# Patient Record
Sex: Male | Born: 2000 | Race: Black or African American | Hispanic: Yes | Marital: Single | Smoking: Never smoker
Health system: Southern US, Community
[De-identification: ages and names within clinical notes are randomized; demographics above are authoritative.]

## PROBLEM LIST (undated history)

## (undated) DIAGNOSIS — J45909 Unspecified asthma, uncomplicated: Secondary | ICD-10-CM

## (undated) DIAGNOSIS — H1010 Acute atopic conjunctivitis, unspecified eye: Secondary | ICD-10-CM

## (undated) DIAGNOSIS — K219 Gastro-esophageal reflux disease without esophagitis: Secondary | ICD-10-CM

## (undated) DIAGNOSIS — J309 Allergic rhinitis, unspecified: Secondary | ICD-10-CM

## (undated) HISTORY — PX: TONSILLECTOMY: SUR1361

## (undated) HISTORY — PX: ADENOIDECTOMY: SUR15

## (undated) HISTORY — DX: Gastro-esophageal reflux disease without esophagitis: K21.9

## (undated) HISTORY — DX: Unspecified asthma, uncomplicated: J45.909

## (undated) HISTORY — PX: MYRINGOTOMY WITH TUBE PLACEMENT: SHX5663

## (undated) HISTORY — DX: Acute atopic conjunctivitis, unspecified eye: J30.9

## (undated) HISTORY — DX: Acute atopic conjunctivitis, unspecified eye: H10.10

---
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
## 2021-01-02 LAB — ALLERGENS W/COMP RFLX AREA 2
Alternaria Alternata IgE: <0.1 kU/L
Aspergillus Fumigatus IgE: <0.1 kU/L
Bermuda Grass IgE: <0.1 kU/L
Cedar, Mountain IgE: <0.1 kU/L
Cladosporium Herbarum IgE: <0.1 kU/L
Cockroach, German IgE: <0.1 kU/L
Common Silver Birch IgE: <0.1 kU/L
Cottonwood IgE: <0.1 kU/L
D Farinae IgE: 26.8 kU/L — AB
D Pteronyssinus IgE: 25 kU/L — AB
E001-IgE Cat Dander: <0.1 kU/L
E005-IgE Dog Dander: <0.1 kU/L
Elm, American IgE: <0.1 kU/L
IgE (Immunoglobulin E), Serum: 63 IU/mL (ref 6–495)
Johnson Grass IgE: <0.1 kU/L
Maple/Box Elder IgE: <0.1 kU/L
Mouse Urine IgE: <0.1 kU/L
Oak, White IgE: <0.1 kU/L
Pecan, Hickory IgE: 0.14 kU/L — AB
Penicillium Chrysogen IgE: <0.1 kU/L
Pigweed, Rough IgE: <0.1 kU/L
Ragweed, Short IgE: <0.1 kU/L
Sheep Sorrel IgE Qn: <0.1 kU/L
Timothy Grass IgE: <0.1 kU/L
White Mulberry IgE: <0.1 kU/L

## 2021-03-03 ENCOUNTER — Other Ambulatory Visit: Payer: Self-pay

## 2021-03-03 ENCOUNTER — Encounter: Payer: Self-pay | Admitting: Pediatrics

## 2021-03-03 ENCOUNTER — Ambulatory Visit (INDEPENDENT_AMBULATORY_CARE_PROVIDER_SITE_OTHER): Payer: Medicaid Other | Admitting: Pediatrics

## 2021-03-03 VITALS — BP 118/82 | HR 81 | Ht 68.7 in | Wt 153.2 lb

## 2021-03-03 DIAGNOSIS — Z0000 Encounter for general adult medical examination without abnormal findings: Secondary | ICD-10-CM

## 2021-03-03 DIAGNOSIS — L2089 Other atopic dermatitis: Secondary | ICD-10-CM | POA: Diagnosis not present

## 2021-03-03 DIAGNOSIS — Z713 Dietary counseling and surveillance: Secondary | ICD-10-CM

## 2021-03-03 DIAGNOSIS — Z113 Encounter for screening for infections with a predominantly sexual mode of transmission: Secondary | ICD-10-CM

## 2021-03-03 DIAGNOSIS — J3089 Other allergic rhinitis: Secondary | ICD-10-CM | POA: Diagnosis not present

## 2021-03-03 DIAGNOSIS — J4540 Moderate persistent asthma, uncomplicated: Secondary | ICD-10-CM

## 2021-03-03 MED ORDER — ALBUTEROL SULFATE HFA 108 (90 BASE) MCG/ACT IN AERS: 4.0000 | INHALATION_SPRAY | Freq: Four times a day (QID) | RESPIRATORY_TRACT | 11 refills | Status: DC | PRN

## 2021-03-03 MED ORDER — MONTELUKAST SODIUM 10 MG PO TABS: ORAL_TABLET | ORAL | 11 refills | Status: DC

## 2021-03-03 MED ORDER — FLUTICASONE PROPIONATE HFA 110 MCG/ACT IN AERO: INHALATION_SPRAY | RESPIRATORY_TRACT | 11 refills | Status: DC

## 2021-03-03 MED ORDER — TRIAMCINOLONE ACETONIDE 0.025 % EX OINT: 1.0000 "application " | TOPICAL_OINTMENT | Freq: Two times a day (BID) | CUTANEOUS | 0 refills | Status: DC

## 2021-03-03 MED ORDER — CETIRIZINE HCL 10 MG PO TABS: 10.0000 mg | ORAL_TABLET | Freq: Two times a day (BID) | ORAL | 11 refills | Status: DC

## 2021-03-03 MED ORDER — OLOPATADINE HCL 0.2 % OP SOLN: 1.0000 [drp] | Freq: Every day | OPHTHALMIC | 11 refills | Status: AC | PRN

## 2021-03-03 MED ORDER — FLUTICASONE PROPIONATE 50 MCG/ACT NA SUSP: 2.0000 | Freq: Every day | NASAL | 11 refills | Status: DC

## 2021-03-03 NOTE — Patient Instructions (Signed)
Well Child Nutrition, Teen This sheet provides general nutrition recommendations. Talk with a health care provider or a diet and nutrition specialist (dietitian) if you have any questions. Nutrition The amount of food you need to eat every day depends on your age, sex, size, and activity level. To figure out your daily calorie needs, look for a calorie calculator online or talk with your health care provider. Balanced diet Eat a balanced diet. Try to include: Fruits. Aim for 1-2 cups a day. Examples of 1 cup of fruit include 1 large banana, 1 small apple, 8 large strawberries, or 1 large orange. Try to eat fresh or frozen fruits, and avoid fruits that have added sugars. Vegetables. Aim for 2-3 cups a day. Examples of 1 cup of vegetables include 2 medium carrots, 1 large tomato, or 2 stalks of celery. Try to eat vegetables with a variety of colors. Low-fat dairy. Aim for 3 cups a day. Examples of 1 cup of dairy include 8 oz (230 mL) of milk, 8 oz (230 g) of yogurt, or 1 oz (44 g) of natural cheese. Getting enough calcium and vitamin D is important for growth and healthy bones. Include fat-free or low-fat milk, cheese, and yogurt in your diet. If you are unable to tolerate dairy (lactose intolerant) or you choose not to consume dairy, you may include fortified soy beverages (soy milk). Whole grains. Of the grain foods that you eat each day (such as pasta, rice, and tortillas), aim to include 6-8 "ounce-equivalents" of whole-grain options. Examples of 1 ounce-equivalent of whole grains include 1 cup of whole-wheat cereal,  cup of brown rice, or 1 slice of whole-wheat bread. Lean proteins. Aim for 5-6 "ounce-equivalents" a day. Eat a variety of protein foods, including lean meats, seafood, poultry, eggs, legumes (beans and peas), nuts, seeds, and soy products. A cut of meat or fish that is the size of a deck of cards is about 3-4 ounce-equivalents. Foods that provide 1 ounce-equivalent of protein  include 1 egg,  cup of nuts or seeds, or 1 tablespoon (16 g) of peanut butter. For more information and options for foods in a balanced diet, visit www.choosemyplate.gov Tips for healthy snacking A snack should not be the size of a full meal. Eat snacks that have 200 calories or less. Examples include:  whole-wheat pita with  cup hummus. 2 or 3 slices of deli turkey wrapped around one cheese stick.  apple with 1 tablespoon of peanut butter. 10 baked chips with salsa. Keep cut-up fruits and vegetables available at home and at school so they are easy to eat. Pack healthy snacks the night before or when you pack your lunch. Avoid pre-packaged foods. These tend to be higher in fat, sugar, and salt (sodium). Get involved with shopping, or ask the main food shopper in your family to get healthy snacks that you like. Avoid chips, candy, cake, and soft drinks. Foods to avoid Fried or heavily processed foods, such as hot dogs and microwaveable dinners. Drinks that contain a lot of sugar, such as sports drinks, sodas, and juice. Foods that contain a lot of fat, salt (sodium), or sugar. General instructions Make time for regular exercise. Try to be active for 60 minutes every day. Drink plenty of water, especially while you are playing sports or exercising. Do not skip meals, especially breakfast. Avoid overeating. Eat when you are hungry, and stop eating when you are full. Do not hesitate to try new foods. Help with meal prep and learn how to   prepare meals. Avoid fad diets. These may affect your mood and growth. If you are worried about your body image, talk with your parents, your health care provider, or another trusted adult like a coach or counselor. You may be at risk for developing an eating disorder. Eating disorders can lead to serious medical problems. Food allergies may cause you to have a reaction (such as a rash, diarrhea, or vomiting) after eating or drinking. Talk with your health  care provider if you have concerns about food allergies. Summary Eat a balanced diet. Include whole grains, fruits, vegetables, proteins, and low-fat dairy. Choose healthy snacks that are 200 calories or less. Drink plenty of water. Be active for 60 minutes or more every day. This information is not intended to replace advice given to you by your health care provider. Make sure you discuss any questions you have with your health care provider. Document Revised: 07/21/2020 Document Reviewed: 07/21/2020 Elsevier Patient Education  2022 [employer].  

## 2021-03-03 NOTE — Progress Notes (Signed)
Marvin Marks is a 20 y.o. who presents for a well check. Patient is the primary historian during today's visit.   SUBJECTIVE:  CONCERNS:   Rash under arms, comes and goes.   NUTRITION:   Milk:  Whole milk, 3 cups Soda/Juice/Gatorade:  1 cup Water:  2-3 cups Solids:  Eats fruits, some vegetables, meats  EXERCISE:  Weightlifting, running  ELIMINATION:  Voids multiple times a day; Firm stools every    HOME LIFE:      Patient lives at home with Guardian. Feels safe at home. No guns in the house.  SLEEP:   8 hours SAFETY:  Wears seat belt all the time.   PEER RELATIONS:  Socializes well. (+) Social media  PHQ-9 Adolescent: PHQ-Adolescent 03/03/2020 03/03/2021  Down, depressed, hopeless 0 0  Decreased interest 0 0  Altered sleeping 1 0  Change in appetite 0 0  Tired, decreased energy 0 0  Feeling bad or failure about yourself 0 0  Trouble concentrating 0 0  Moving slowly or fidgety/restless 0 0  Suicidal thoughts 0 0  PHQ-Adolescent Score 1 0  In the past year have you felt depressed or sad most days, even if you felt okay sometimes? No No  If you are experiencing any of the problems on this form, how difficult have these problems made it for you to do your work, take care of things at home or get along with other people? - Not difficult at all  Has there been a time in the past month when you have had serious thoughts about ending your own life? No No  Have you ever, in your whole life, tried to kill yourself or made a suicide attempt? No No      DEVELOPMENT:  SCHOOL: [employer] S[employer] PERFORMANCE:  Did well in HS WORK: no DRIVING:  yes  Social History   Tobacco Use   Smoking status: Never   Smokeless tobacco: Never  Vaping Use   Vaping Use: Never used  Substance Use Topics   Alcohol use: No   Drug use: No    Social History   Substance and Sexual Activity  Sexual Activity Yes   Birth control/protection: Condom   Comment: Heterosexual    Past  Medical History:  Diagnosis Date   Allergic rhinoconjunctivitis    Asthma    Gastroesophageal reflux    Unspecified asthma(493.90) 01/27/2013     Past Surgical History:  Procedure Laterality Date   ADENOIDECTOMY     MYRINGOTOMY WITH TUBE PLACEMENT     TONSILLECTOMY       Family History  Problem Relation Age of Onset   Asthma Father    Asthma Maternal Grandmother    Allergic rhinitis Neg Hx    Angioedema Neg Hx    Eczema Neg Hx    Immunodeficiency Neg Hx    Urticaria Neg Hx    Atopy Neg Hx     No Known Allergies  Current Outpatient Medications  Medication Sig Dispense Refill   Melatonin 3 MG TABS Take 1 tablet by mouth at bedtime.     albuterol (PROAIR HFA) 108 (90 Base) MCG/ACT inhaler Inhale 4 puffs into the lungs every 6 (six) hours as needed for wheezing or shortness of breath. 18 g 11   cetirizine (ZYRTEC) 10 MG tablet Take 1 tablet (10 mg total) by mouth 2 (two) times daily. TAKE ONE TABLET (10 MG TOTAL) BY MOUTH DAILY. 60 tablet 11   fluticasone (FLONASE) 50 MCG/ACT nasal spray  Place 2 sprays into both nostrils daily. 16 g 11   fluticasone (FLOVENT HFA) 110 MCG/ACT inhaler INHALE ONE PUFF INTO THE LUNGS TWO TIMES DAILY FOR 2 WEEKS 12 g 11   montelukast (SINGULAIR) 10 MG tablet TAKE ONE TABLET (10 MG TOTAL) BY MOUTH AT BEDTIME. 30 tablet 11   Olopatadine HCl (PATADAY) 0.2 % SOLN Apply 1 drop to eye daily as needed (FOR ALLERGIES). 2.5 mL 11   triamcinolone (KENALOG) 0.025 % ointment APPLY TWO TIMES DAILY 30 g 0   No current facility-administered medications for this visit.       Review of Systems  Constitutional: Negative.  Negative for activity change and fever.  HENT: Negative.  Negative for ear pain, rhinorrhea and sore throat.   Eyes: Negative.  Negative for pain.  Respiratory: Negative.  Negative for cough, chest tightness and shortness of breath.   Cardiovascular: Negative.  Negative for chest pain.  Gastrointestinal: Negative.  Negative for abdominal pain,  constipation, diarrhea and vomiting.  Endocrine: Negative.   Genitourinary: Negative.  Negative for difficulty urinating.  Musculoskeletal: Negative.  Negative for joint swelling.  Skin:  Positive for rash.  Neurological: Negative.  Negative for headaches.  Psychiatric/Behavioral: Negative.      OBJECTIVE:  Wt Readings from Last 3 Encounters:  03/03/21 153 lb 3.2 oz (69.5 kg) (53 %, Z= 0.08)*  12/22/20 159 lb 14.4 oz (72.5 kg) (65 %, Z= 0.37)*  12/03/20 156 lb 12.8 oz (71.1 kg) (60 %, Z= 0.27)*   * Growth percentiles are based on CDC (Boys, 2-20 Years) data.   Ht Readings from Last 3 Encounters:  03/03/21 5' 8.7" (1.745 m) (39 %, Z= -0.28)*  12/22/20 5\' 9"  (1.753 m) (44 %, Z= -0.16)*  12/03/20 5' 9.09" (1.755 m) (45 %, Z= -0.12)*   * Growth percentiles are based on CDC (Boys, 2-20 Years) data.    Body mass index is 22.82 kg/m.   57 %ile (Z= 0.18) based on CDC (Boys, 2-20 Years) BMI-for-age based on BMI available as of 03/03/2021.  VITALS:  Blood pressure 118/82, pulse 81, height 5' 8.7" (1.745 m), weight 153 lb 3.2 oz (69.5 kg), SpO2 100 %.   Hearing Screening   500Hz  1000Hz  2000Hz  3000Hz  4000Hz  5000Hz  6000Hz  8000Hz   Right ear 20 20 20 20 20 20 20 20   Left ear 20 20 20 20 20 20 20 20    Vision Screening   Right eye Left eye Both eyes  Without correction 20/20 20/20 20/20   With correction        PHYSICAL EXAM: GEN:  Alert, active, no acute distress PSYCH:  Mood: pleasant;  Affect:  full range HEENT:  Normocephalic.  Atraumatic. Optic discs sharp bilaterally. Pupils equally round and reactive to light.  Extraoccular muscles intact.  Tympanic canals clear. Tympanic membranes are pearly gray bilaterally.   Turbinates:  normal ; Tongue midline. No pharyngeal lesions.  Dentition normal. NECK:  Supple. Full range of motion.  No thyromegaly.  No lymphadenopathy. CARDIOVASCULAR:  Normal S1, S2.  No murmurs.   CHEST: Normal shape.  LUNGS: Clear to auscultation.   ABDOMEN:   Normoactive polyphonic bowel sounds.  No masses.  No hepatosplenomegaly. EXTREMITIES:  Full ROM. No cyanosis.  No edema. SKIN:  Well perfused. Dry patches in axilla. NEURO:  +5/5 Strength. CN II-XII intact. Normal gait cycle.   SPINE:  No deformities.  No scoliosis.    ASSESSMENT/PLAN:    Marvin Marks is a 20 y.o. teen here for [facility]. Patient is alert, active  and in NAD. Passed hearing and vision screen. Growth curve reviewed. Immunizations UTD.   PHQ-9 reviewed with patient. No suicidal or homicidal ideations.   GC/Ch screen sent. Results will be discussed with patient.  Skin care reviewed.   Medication refill sent.     Meds ordered this encounter  Medications   DISCONTD: triamcinolone (KENALOG) 0.025 % ointment    Sig: Apply 1 application topically 2 (two) times daily.    Dispense:  30 g    Refill:  0   Olopatadine HCl (PATADAY) 0.2 % SOLN    Sig: Apply 1 drop to eye daily as needed (FOR ALLERGIES).    Dispense:  2.5 mL    Refill:  11   montelukast (SINGULAIR) 10 MG tablet    Sig: TAKE ONE TABLET (10 MG TOTAL) BY MOUTH AT BEDTIME.    Dispense:  30 tablet    Refill:  11   fluticasone (FLOVENT HFA) 110 MCG/ACT inhaler    Sig: INHALE ONE PUFF INTO THE LUNGS TWO TIMES DAILY FOR 2 WEEKS    Dispense:  12 g    Refill:  11   fluticasone (FLONASE) 50 MCG/ACT nasal spray    Sig: Place 2 sprays into both nostrils daily.    Dispense:  16 g    Refill:  11   cetirizine (ZYRTEC) 10 MG tablet    Sig: Take 1 tablet (10 mg total) by mouth 2 (two) times daily. TAKE ONE TABLET (10 MG TOTAL) BY MOUTH DAILY.    Dispense:  60 tablet    Refill:  11   albuterol (PROAIR HFA) 108 (90 Base) MCG/ACT inhaler    Sig: Inhale 4 puffs into the lungs every 6 (six) hours as needed for wheezing or shortness of breath.    Dispense:  18 g    Refill:  11    Anticipatory Guidance     - Handout on Young Adult Safety given.      - Discussed growth, diet, and exercise.    - Discussed social media use and limiting  screen time to 2 hours daily.    - Discussed dangers of substance use.    - Discussed lifelong adult responsibility of pregnancy, STDs, and safe sex practices including abstinence.     - Taught self-breast exam.  Taught self-testicular exam.

## 2021-03-10 ENCOUNTER — Telehealth: Payer: Self-pay | Admitting: Pediatrics

## 2021-03-10 DIAGNOSIS — A749 Chlamydial infection, unspecified: Secondary | ICD-10-CM

## 2021-03-10 LAB — CHLAMYDIA/GC NAA, CONFIRMATION
Chlamydia trachomatis, NAA: POSITIVE — AB
Neisseria gonorrhoeae, NAA: NEGATIVE

## 2021-03-10 LAB — C. TRACHOMATIS NAA, CONFIRM: C. trachomatis NAA, Confirm: POSITIVE — AB

## 2021-03-10 MED ORDER — DOXYCYCLINE MONOHYDRATE 100 MG PO TABS: 100.0000 mg | ORAL_TABLET | Freq: Two times a day (BID) | ORAL | 0 refills | Status: AC

## 2021-03-10 NOTE — Telephone Encounter (Signed)
Please advise patient that his Gonorrhea and Chlamydia screen has returned. Patient is negative for Gonorrhea. Patient is POSITIVE for Chlamydia, which is a sexually transmitted infection.   I have sent the medication for treatment to the pharmacy. Patient needs to return in 3 months for a recheck of urine. Patient should tell any sexual partner to also get tested/treated. Please advise sexual activity with condom use.   Meds ordered this encounter  Medications   doxycycline (ADOXA) 100 MG tablet    Sig: Take 1 tablet (100 mg total) by mouth 2 (two) times daily for 7 days.    Dispense:  14 tablet    Refill:  0   I have ordered an HIV and Syphilis screen, patient can pick up the lab order and complete it whenever he can go to the lab.  Orders Placed This Encounter  Procedures   HIV antibody (with reflex)   RPR   Thank you.

## 2021-03-10 NOTE — Telephone Encounter (Signed)
Informed patient; verbalized understanding.

## 2021-04-21 ENCOUNTER — Other Ambulatory Visit: Payer: Self-pay | Admitting: Pediatrics

## 2021-04-21 DIAGNOSIS — L2089 Other atopic dermatitis: Secondary | ICD-10-CM

## 2021-05-13 ENCOUNTER — Encounter: Payer: Self-pay | Admitting: Pediatrics

## 2021-06-22 ENCOUNTER — Ambulatory Visit: Payer: Medicaid Other | Admitting: Allergy & Immunology

## 2021-06-23 ENCOUNTER — Telehealth: Payer: Self-pay | Admitting: Pediatrics

## 2021-06-23 ENCOUNTER — Other Ambulatory Visit: Payer: Self-pay

## 2021-06-23 ENCOUNTER — Emergency Department (HOSPITAL_COMMUNITY)
Admission: EM | Admit: 2021-06-23 | Discharge: 2021-06-23 | Disposition: A | Discharge status: Home / Self Care | Payer: Medicaid Other | Attending: Emergency Medicine | Admitting: Emergency Medicine

## 2021-06-23 ENCOUNTER — Encounter (HOSPITAL_COMMUNITY): Payer: Self-pay

## 2021-06-23 ENCOUNTER — Emergency Department (HOSPITAL_COMMUNITY): Payer: Medicaid Other

## 2021-06-23 DIAGNOSIS — R109 Unspecified abdominal pain: Secondary | ICD-10-CM | POA: Diagnosis not present

## 2021-06-23 DIAGNOSIS — Z7951 Long term (current) use of inhaled steroids: Secondary | ICD-10-CM | POA: Insufficient documentation

## 2021-06-23 DIAGNOSIS — K5900 Constipation, unspecified: Secondary | ICD-10-CM | POA: Insufficient documentation

## 2021-06-23 DIAGNOSIS — R1031 Right lower quadrant pain: Secondary | ICD-10-CM | POA: Insufficient documentation

## 2021-06-23 DIAGNOSIS — J45909 Unspecified asthma, uncomplicated: Secondary | ICD-10-CM | POA: Diagnosis not present

## 2021-06-23 DIAGNOSIS — K219 Gastro-esophageal reflux disease without esophagitis: Secondary | ICD-10-CM | POA: Diagnosis not present

## 2021-06-23 LAB — URINALYSIS, ROUTINE W REFLEX MICROSCOPIC
Bilirubin Urine: NEGATIVE
Glucose, UA: NEGATIVE mg/dL
Hgb urine dipstick: NEGATIVE
Ketones, ur: NEGATIVE mg/dL
Leukocytes,Ua: NEGATIVE
Nitrite: NEGATIVE
Protein, ur: NEGATIVE mg/dL
Specific Gravity, Urine: 1.026 (ref 1.005–1.030)
pH: 7 (ref 5.0–8.0)

## 2021-06-23 LAB — COMPREHENSIVE METABOLIC PANEL
ALT: 23 U/L (ref 0–44)
AST: 30 U/L (ref 15–41)
Albumin: 4.4 g/dL (ref 3.5–5.0)
Alkaline Phosphatase: 59 U/L (ref 38–126)
Anion gap: 9 (ref 5–15)
BUN: 18 mg/dL (ref 6–20)
CO2: 26 mmol/L (ref 22–32)
Calcium: 9.2 mg/dL (ref 8.9–10.3)
Chloride: 103 mmol/L (ref 98–111)
Creatinine, Ser: 1.13 mg/dL (ref 0.61–1.24)
GFR, Estimated: >60 mL/min (ref >60)
Glucose, Bld: 98 mg/dL (ref 70–99)
Potassium: 4.1 mmol/L (ref 3.5–5.1)
Sodium: 138 mmol/L (ref 135–145)
Total Bilirubin: 0.7 mg/dL (ref 0.3–1.2)
Total Protein: 7.2 g/dL (ref 6.5–8.1)

## 2021-06-23 LAB — CBC
HCT: 42.6 % (ref 39.0–52.0)
Hemoglobin: 15.3 g/dL (ref 13.0–17.0)
MCH: 31.7 pg (ref 26.0–34.0)
MCHC: 35.9 g/dL (ref 30.0–36.0)
MCV: 88.4 fL (ref 80.0–100.0)
Platelets: 244 10*3/uL (ref 150–400)
RBC: 4.82 MIL/uL (ref 4.22–5.81)
RDW: 12.9 % (ref 11.5–15.5)
WBC: 4.8 10*3/uL (ref 4.0–10.5)
nRBC: 0 % (ref 0.0–0.2)

## 2021-06-23 LAB — LIPASE, BLOOD: Lipase: 37 U/L (ref 11–51)

## 2021-06-23 MED ORDER — IOHEXOL 300 MG/ML  SOLN
100.0000 mL | Freq: Once | INTRAMUSCULAR | Status: AC | PRN
  Administered 2021-06-23: 80 mL via INTRAVENOUS

## 2021-06-23 NOTE — ED Provider Notes (Signed)
[facility] EMERGENCY DEPARTMENT Provider Note   CSN: [id number] Arrival date & time: 06/23/21  3532     History Chief Complaint  Patient presents with   Abdominal Pain    MAHMOUD BLAZEJEWSKI is a 20 y.o. male.   Abdominal Pain Associated symptoms: constipation   Associated symptoms: no chest pain, no chills, no fatigue, no fever, no nausea, no shortness of breath and no vomiting       KOLESON REIFSTECK is a 20 y.o. male who presents to the Emergency Department complaining of right lower abdominal pain and sensation of fullness of his abdomen.  Symptoms have been present for 4 days.  No bowel movement x3 days.  Continues to have appetite.  Nothing makes abdominal pain better or worse.  He denies fever, nausea, vomiting, and history of constipation.  He does endorse eating lots of bread and "junk food."  No history of prior abdominal surgeries.    Past Medical History:  Diagnosis Date   Allergic rhinoconjunctivitis    Asthma    Gastroesophageal reflux    Unspecified asthma(493.90) 01/27/2013    Patient Active Problem List   Diagnosis Date Noted   Perennial allergic rhinitis 08/02/2018   Mild persistent asthma, uncomplicated 08/02/2018   Headache(784.0) 10/06/2013   Unspecified asthma(493.90) 01/27/2013   GE reflux 02/19/2012    Past Surgical History:  Procedure Laterality Date   ADENOIDECTOMY     MYRINGOTOMY WITH TUBE PLACEMENT     TONSILLECTOMY         Family History  Problem Relation Age of Onset   Asthma Father    Asthma Maternal Grandmother    Allergic rhinitis Neg Hx    Angioedema Neg Hx    Eczema Neg Hx    Immunodeficiency Neg Hx    Urticaria Neg Hx    Atopy Neg Hx     Social History   Tobacco Use   Smoking status: Never   Smokeless tobacco: Never  Vaping Use   Vaping Use: Never used  Substance Use Topics   Alcohol use: No   Drug use: Yes    Types: Marijuana    Home Medications Prior to Admission medications   Medication Sig Start Date  End Date Taking? Authorizing Provider  albuterol (PROAIR HFA) 108 (90 Base) MCG/ACT inhaler Inhale 4 puffs into the lungs every 6 (six) hours as needed for wheezing or shortness of breath. 03/03/21   Vella Kohler, MD  cetirizine (ZYRTEC) 10 MG tablet Take 1 tablet (10 mg total) by mouth 2 (two) times daily. TAKE ONE TABLET (10 MG TOTAL) BY MOUTH DAILY. 03/03/21   Vella Kohler, MD  fluticasone (FLONASE) 50 MCG/ACT nasal spray Place 2 sprays into both nostrils daily. 03/03/21   Vella Kohler, MD  fluticasone (FLOVENT HFA) 110 MCG/ACT inhaler INHALE ONE PUFF INTO THE LUNGS TWO TIMES DAILY FOR 2 WEEKS 03/03/21   Vella Kohler, MD  Melatonin 3 MG TABS Take 1 tablet by mouth at bedtime.    [provider]  montelukast (SINGULAIR) 10 MG tablet TAKE ONE TABLET (10 MG TOTAL) BY MOUTH AT BEDTIME. 03/03/21   Vella Kohler, MD  Olopatadine HCl (PATADAY) 0.2 % SOLN Apply 1 drop to eye daily as needed (FOR ALLERGIES). 03/03/21   Vella Kohler, MD  triamcinolone (KENALOG) 0.025 % ointment APPLY TWO TIMES DAILY 04/21/21   Vella Kohler, MD    Allergies    Patient has no known allergies.  Review of Systems  Review of Systems  Constitutional:  Negative for activity change, chills, fatigue and fever.  Respiratory:  Negative for chest tightness and shortness of breath.   Cardiovascular:  Negative for chest pain.  Gastrointestinal:  Positive for abdominal pain and constipation. Negative for nausea and vomiting.  Genitourinary:  Negative for difficulty urinating, penile discharge, penile pain, penile swelling and scrotal swelling.  Musculoskeletal:  Negative for arthralgias and back pain.  Skin:  Negative for rash.  Neurological:  Negative for weakness, numbness and headaches.  All other systems reviewed and are negative.  Physical Exam Updated Vital Signs BP 120/82   Pulse (!) 51   Temp 98.2 F (36.8 C) (Oral)   Resp 16   Ht 5\' 10"  (1.778 m)   Wt 68 kg   SpO2 93%   BMI 21.52  kg/m   Physical Exam Vitals and nursing note reviewed.  Constitutional:      General: He is not in acute distress.    Appearance: He is well-developed. He is not ill-appearing or toxic-appearing.  HENT:     Head: Atraumatic.     Mouth/Throat:     Mouth: Mucous membranes are moist.  Cardiovascular:     Rate and Rhythm: Normal rate and regular rhythm.  Pulmonary:     Effort: Pulmonary effort is normal.     Breath sounds: Normal breath sounds.  Abdominal:     General: Abdomen is flat. There is no distension.     Palpations: Abdomen is soft.     Tenderness: There is abdominal tenderness in the right lower quadrant. There is no right CVA tenderness, left CVA tenderness, guarding or rebound. Positive signs include McBurney's sign.  Musculoskeletal:        General: Normal range of motion.  Skin:    General: Skin is warm.     Capillary Refill: Capillary refill takes less than 2 seconds.     Findings: No rash.  Neurological:     General: No focal deficit present.     Mental Status: He is alert.     Sensory: No sensory deficit.     Motor: No weakness.  Psychiatric:        Mood and Affect: Mood normal.    ED Results / Procedures / Treatments   Labs (all labs ordered are listed, but only abnormal results are displayed) Labs Reviewed  LIPASE, BLOOD  COMPREHENSIVE METABOLIC PANEL  CBC  URINALYSIS, ROUTINE W REFLEX MICROSCOPIC    EKG None  Radiology CT ABDOMEN PELVIS W CONTRAST  Result Date: 06/23/2021 CLINICAL DATA:  Lower abdominal pain with constipation. Evaluate for appendicitis. EXAM: CT ABDOMEN AND PELVIS WITH CONTRAST TECHNIQUE: Multidetector CT imaging of the abdomen and pelvis was performed using the standard protocol following bolus administration of intravenous contrast. CONTRAST:  38mL OMNIPAQUE IOHEXOL 300 MG/ML  SOLN COMPARISON:  None. FINDINGS: Lower chest: Clear lung bases. Normal heart size without pericardial or pleural effusion. Hepatobiliary: Normal liver.  Normal gallbladder, without biliary ductal dilatation. Pancreas: Normal, without mass or ductal dilatation. Spleen: Normal in size, without focal abnormality. Adrenals/Urinary Tract: Normal adrenal glands. Normal kidneys, without hydronephrosis. Normal urinary bladder. Stomach/Bowel: Normal stomach, without wall thickening. Normal colon and terminal ileum. Normal small bowel caliber. The appendix is difficult to visualize, secondary to paucity of fat and lack of contrast. Possibly identified on 60/2 and coronal image 44. No right lower quadrant inflammation seen. Vascular/Lymphatic: Normal caliber of the aorta and branch vessels. No abdominopelvic adenopathy. Reproductive: Normal prostate. Other: No significant free fluid.  No free intraperitoneal air. Musculoskeletal: No acute osseous abnormality. IMPRESSION: Although the appendix and less so bowel is suboptimally evaluated secondary to paucity of fat and lack of IV contrast, no evidence of appendicitis or explanation for patient's symptoms identified. Electronically Signed   By: Jeronimo Greaves M.D.   On: 06/23/2021 14:36    Procedures Procedures   Medications Ordered in ED Medications  iohexol (OMNIPAQUE) 300 MG/ML solution 100 mL (80 mLs Intravenous Contrast Given 06/23/21 1413)    ED Course  I have reviewed the triage vital signs and the nursing notes.  Pertinent labs & imaging results that were available during my care of the patient were reviewed by me and considered in my medical decision making (see chart for details).    MDM Rules/Calculators/A&P                           Patient here with 4-day history of abdominal pain and bloating.  Reports constipation for several days.  Denies decreased appetite, fever, chills, dysuria and nausea or vomiting.  On exam, patient well-appearing nontoxic.  He does have some tenderness of the right lower quadrant without guarding or rebound.  Given duration of symptoms, appendicitis is felt to be less  likely but cannot be ruled out. Labs interpreted by me are unremarkable.  No leukocytosis.  We will proceed with CT imaging for further evaluation of right lower quadrant pain.  On recheck, patient resting comfortably.  CT abdomen and pelvis without findings suggestive of acute appendicitis.  I have discussed findings with patient and family member.  Clinically I have low suspicion for acute appendicitis, I have thoroughly discussed need for emergency department return if symptoms worsen.  Patient verbalized understanding agrees to plan.  At this time, he appears appropriate for discharge home.  All questions were answered.    Final Clinical Impression(s) / ED Diagnoses Final diagnoses:  Right lower quadrant abdominal pain    Rx / DC Orders ED Discharge Orders     None        Pauline Aus, PA-C 06/24/21 0840    Terrilee Files, MD 06/24/21 [phone]

## 2021-06-23 NOTE — ED Triage Notes (Signed)
Pt to er, pt c/o lower abd pain, states that he has been having the pain for the past few days.  Pt states nothing seems to make it better or worse, pt reports constipation.

## 2021-06-23 NOTE — Discharge Instructions (Signed)
Blood work, urine test and CT of his abdomen are all reassuring.  His symptoms may be related to some constipation.  I recommend that you use over-the-counter MiraLAX, 1 capful of powder mixed with 8 to 10 ounces of water or juice.  Take this 1 time daily until improvement of bowel movements.  Please increase fruits and vegetables in your diet along with plenty of water.  Return to the emergency department for any new or worsening symptoms such as fever, vomiting, or increasing right lower quadrant pain.

## 2021-06-23 NOTE — Telephone Encounter (Signed)
Spoke to grandmother, she took him to the ER

## 2021-06-23 NOTE — Telephone Encounter (Signed)
Spoke to grandmother, he has had constipation with stomach pain for 6 days. Per grandmother, he has not had a bowel movement in 6 days. He has been given warm apple juice. He has not tried stool softeners or a laxative to help. He does have a cough but he has history of asthma. He is using his inhaler, nose spray, and 2 allergy pills that does help

## 2021-06-23 NOTE — Telephone Encounter (Signed)
Patient has constipation and stomach pain that has been going on for about six days.  Request an appt for today.

## 2021-06-23 NOTE — ED Notes (Signed)
Patient to CT at this time

## 2021-06-23 NOTE — Telephone Encounter (Signed)
For immediate relief can give a fleets enema. Should then start a daily stool softener e.g Miralax or Milk of Magnesia until regular pattern is restored. Can give a stimulant laxative e.g. Exlax which will have an effect after a few hours.  Can also give Citrate of magnesium ( drink). Give 1 full bottle now then repeat at bedtime. Patient should only eat lightly until he stools. Offer copious clear beverages such as water or sports drinks. Can also start full strength prune juice. Can have Tylenol for pain.

## 2021-06-24 ENCOUNTER — Telehealth: Payer: Self-pay

## 2021-06-24 NOTE — Telephone Encounter (Signed)
Transition Care Management Unsuccessful Follow-up Telephone Call  Date of discharge and from where:  06/23/2021[facility]   Attempts:  1st Attempt  Reason for unsuccessful TCM follow-up call:  Left voice message    

## 2021-06-27 NOTE — Telephone Encounter (Signed)
Transition Care Management Follow-up Telephone Call Date of discharge and from where: 06/23/2021 from [facility] How have you been since you were released from the hospital? Pt stated that he is feeling better and did not have any questions or concerns.  Any questions or concerns? No  Items Reviewed: Did the pt receive and understand the discharge instructions provided? Yes  Medications obtained and verified? Yes  Other? No  Any new allergies since your discharge? No  Dietary orders reviewed? No Do you have support at home? Yes   Functional Questionnaire: (I = Independent and D = Dependent) ADLs: I Bathing/Dressing- I Meal Prep- I Eating- I Maintaining continence- I Transferring/Ambulation- I Managing Meds- I  Follow up appointments reviewed: PCP Hospital f/u appt confirmed? No   Specialist Hospital f/u appt confirmed? No   Are transportation arrangements needed? No  If their condition worsens, is the pt aware to call PCP or go to the Emergency Dept.? Yes Was the patient provided with contact information for the PCP's office or ED? Yes Was to pt encouraged to call back with questions or concerns? Yes

## 2021-07-02 ENCOUNTER — Encounter (HOSPITAL_COMMUNITY): Payer: Self-pay | Admitting: Emergency Medicine

## 2021-07-02 ENCOUNTER — Emergency Department (HOSPITAL_COMMUNITY)
Admission: EM | Admit: 2021-07-02 | Discharge: 2021-07-04 | Disposition: A | Discharge status: Home / Self Care | Payer: Medicaid Other | Attending: Emergency Medicine | Admitting: Emergency Medicine

## 2021-07-02 ENCOUNTER — Other Ambulatory Visit: Payer: Self-pay

## 2021-07-02 DIAGNOSIS — Z20822 Contact with and (suspected) exposure to covid-19: Secondary | ICD-10-CM | POA: Diagnosis not present

## 2021-07-02 DIAGNOSIS — F19959 Other psychoactive substance use, unspecified with psychoactive substance-induced psychotic disorder, unspecified: Secondary | ICD-10-CM | POA: Diagnosis not present

## 2021-07-02 DIAGNOSIS — F22 Delusional disorders: Secondary | ICD-10-CM | POA: Diagnosis not present

## 2021-07-02 DIAGNOSIS — Y900 Blood alcohol level of less than 20 mg/100 ml: Secondary | ICD-10-CM | POA: Insufficient documentation

## 2021-07-02 DIAGNOSIS — J4530 Mild persistent asthma, uncomplicated: Secondary | ICD-10-CM | POA: Diagnosis not present

## 2021-07-02 DIAGNOSIS — Z79899 Other long term (current) drug therapy: Secondary | ICD-10-CM | POA: Diagnosis not present

## 2021-07-02 DIAGNOSIS — Z7951 Long term (current) use of inhaled steroids: Secondary | ICD-10-CM | POA: Insufficient documentation

## 2021-07-02 DIAGNOSIS — R4182 Altered mental status, unspecified: Secondary | ICD-10-CM | POA: Diagnosis not present

## 2021-07-02 DIAGNOSIS — F12159 Cannabis abuse with psychotic disorder, unspecified: Secondary | ICD-10-CM | POA: Diagnosis present

## 2021-07-02 DIAGNOSIS — R519 Headache, unspecified: Secondary | ICD-10-CM | POA: Diagnosis not present

## 2021-07-02 DIAGNOSIS — R9431 Abnormal electrocardiogram [ECG] [EKG]: Secondary | ICD-10-CM | POA: Diagnosis not present

## 2021-07-02 DIAGNOSIS — R739 Hyperglycemia, unspecified: Secondary | ICD-10-CM | POA: Insufficient documentation

## 2021-07-02 DIAGNOSIS — F1290 Cannabis use, unspecified, uncomplicated: Secondary | ICD-10-CM

## 2021-07-02 DIAGNOSIS — R5383 Other fatigue: Secondary | ICD-10-CM | POA: Insufficient documentation

## 2021-07-02 LAB — COMPREHENSIVE METABOLIC PANEL WITH GFR
ALT: 26 U/L (ref 0–44)
AST: 56 U/L — ABNORMAL HIGH (ref 15–41)
Albumin: 4.9 g/dL (ref 3.5–5.0)
Alkaline Phosphatase: 52 U/L (ref 38–126)
Anion gap: 11 (ref 5–15)
BUN: 19 mg/dL (ref 6–20)
CO2: 25 mmol/L (ref 22–32)
Calcium: 9.2 mg/dL (ref 8.9–10.3)
Chloride: 100 mmol/L (ref 98–111)
Creatinine, Ser: 0.95 mg/dL (ref 0.61–1.24)
GFR, Estimated: >60 mL/min
Glucose, Bld: 104 mg/dL — ABNORMAL HIGH (ref 70–99)
Potassium: 3.9 mmol/L (ref 3.5–5.1)
Sodium: 136 mmol/L (ref 135–145)
Total Bilirubin: 0.8 mg/dL (ref 0.3–1.2)
Total Protein: 8.1 g/dL (ref 6.5–8.1)

## 2021-07-02 LAB — CBC WITH DIFFERENTIAL/PLATELET
Abs Immature Granulocytes: 0.04 10*3/uL (ref 0.00–0.07)
Basophils Absolute: 0 10*3/uL (ref 0.0–0.1)
Basophils Relative: 0 %
Eosinophils Absolute: 0 10*3/uL (ref 0.0–0.5)
Eosinophils Relative: 0 %
HCT: 44.3 % (ref 39.0–52.0)
Hemoglobin: 15.6 g/dL (ref 13.0–17.0)
Immature Granulocytes: 1 %
Lymphocytes Relative: 11 %
Lymphs Abs: 0.9 10*3/uL (ref 0.7–4.0)
MCH: 30.5 pg (ref 26.0–34.0)
MCHC: 35.2 g/dL (ref 30.0–36.0)
MCV: 86.7 fL (ref 80.0–100.0)
Monocytes Absolute: 0.6 10*3/uL (ref 0.1–1.0)
Monocytes Relative: 8 %
Neutro Abs: 6.1 10*3/uL (ref 1.7–7.7)
Neutrophils Relative %: 80 %
Platelets: 240 10*3/uL (ref 150–400)
RBC: 5.11 MIL/uL (ref 4.22–5.81)
RDW: 12.2 % (ref 11.5–15.5)
WBC: 7.5 10*3/uL (ref 4.0–10.5)
nRBC: 0 % (ref 0.0–0.2)

## 2021-07-02 LAB — ETHANOL: Alcohol, Ethyl (B): <10 mg/dL (ref <10)

## 2021-07-02 LAB — RAPID URINE DRUG SCREEN, HOSP PERFORMED
Amphetamines: NOT DETECTED
Barbiturates: NOT DETECTED
Benzodiazepines: NOT DETECTED
Cocaine: NOT DETECTED
Opiates: NOT DETECTED
Tetrahydrocannabinol: POSITIVE — AB

## 2021-07-02 LAB — RESP PANEL BY RT-PCR (FLU A&B, COVID) ARPGX2
Influenza A by PCR: POSITIVE — AB
Influenza B by PCR: NEGATIVE
SARS Coronavirus 2 by RT PCR: NEGATIVE

## 2021-07-02 LAB — CBG MONITORING, ED: Glucose-Capillary: 98 mg/dL (ref 70–99)

## 2021-07-02 NOTE — ED Provider Notes (Signed)
[facility] Provider Note   CSN: [id number] Arrival date & time: 07/02/21  1612     History Chief Complaint  Patient presents with   Fatigue    Marvin Marks is a 20 y.o. male emergency room with grandmother for evaluation.  Grandmother brought the patient in and reported he is not acting been acting right or speaking right.  She reports he is not having strange behavior for the past few weeks.  She reports he has not been acting right since last night after he saw his dog and hit by a car.  Additionally, the grandma mentions that he has been acting strange, talking about himself in the third person pretending to eat Hebrew, mentioning "spirit".  Grandma and the patient both report the story to me of how he left his clothing and computer bag in the woods as a offering to "spirit".  The patient reported to me that he "thinks "that he is having hallucinations.  He reports that the equals talk to him and are leading his past.  He reports that he sees faces in the moon.  The patient reports that he does smoke marijuana.  He reports some depression, but denies any suicidal ideations or homicidal ideations.  He denies any other drug or substance use.  Denies any tobacco use.  Denies any alcohol use.  Medical history includes asthma.  No known drug allergies.  The history is provided by the patient and a caregiver. No language interpreter was used.      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   Perennial allergic rhinitis 08/02/2018   Mild persistent asthma, uncomplicated [id number]   Headache(784.0) 10/06/2013   Unspecified asthma(493.90) 01/27/2013   GE reflux 02/19/2012    Past  Surgical History:  Procedure Laterality Date   ADENOIDECTOMY     MYRINGOTOMY WITH TUBE PLACEMENT     TONSILLECTOMY         Family History  Problem Relation Age of Onset   Asthma Father    Asthma Maternal Grandmother    Allergic rhinitis Neg Hx    Angioedema Neg Hx    Eczema Neg Hx    Immunodeficiency Neg Hx    Urticaria Neg Hx    Atopy Neg Hx     Social History   Tobacco Use   Smoking status: Never   Smokeless tobacco: Never  Vaping Use   Vaping Use: Never used  Substance Use Topics   Alcohol use: No   Drug use: Yes    Types: Marijuana    Home Medications Prior to Admission medications   Medication Sig Start Date End Date Taking? Authorizing Provider  albuterol (PROAIR HFA) 108 (90 Base) MCG/ACT inhaler Inhale 4 puffs into the lungs every 6 (six) hours as needed for wheezing or shortness of breath. 03/03/21  Yes Mannie Stabile, MD  cetirizine (ZYRTEC) 10 MG tablet Take 1 tablet (10 mg total) by mouth 2 (two) times daily. TAKE ONE TABLET (10 MG TOTAL) BY MOUTH DAILY. 03/03/21  Yes Mannie Stabile, MD  fluticasone (FLONASE) 50 MCG/ACT nasal spray Place 2 sprays into both nostrils daily. 03/03/21  Yes Mannie Stabile, MD  montelukast (SINGULAIR) 10 MG tablet TAKE  ONE TABLET (10 MG TOTAL) BY MOUTH AT BEDTIME. 03/03/21  Yes Mannie Stabile, MD  Olopatadine HCl (PATADAY) 0.2 % SOLN Apply 1 drop to eye daily as needed (FOR ALLERGIES). 03/03/21  Yes Mannie Stabile, MD  doxycycline (VIBRA-TABS) 100 MG tablet Take 100 mg by mouth 2 (two) times daily. Patient not taking: Reported on 07/02/2021 03/10/21   [provider]  fluticasone (FLOVENT HFA) 110 MCG/ACT inhaler INHALE ONE PUFF INTO THE LUNGS TWO TIMES DAILY FOR 2 WEEKS Patient not taking: Reported on 07/02/2021 03/03/21   Mannie Stabile, MD  Melatonin 3 MG TABS Take 1 tablet by mouth at bedtime. Patient not taking: Reported on 07/02/2021    [provider]  triamcinolone (KENALOG) 0.025 % ointment APPLY  TWO TIMES DAILY Patient not taking: Reported on 07/02/2021 04/21/21   Mannie Stabile, MD    Allergies    Patient has no known allergies.  Review of Systems   Review of Systems  Constitutional:  Negative for chills and fever.  HENT:  Negative for ear pain and sore throat.   Eyes:  Negative for pain and visual disturbance.  Respiratory:  Negative for cough and shortness of breath.   Cardiovascular:  Negative for chest pain and palpitations.  Gastrointestinal:  Negative for abdominal pain and vomiting.  Genitourinary:  Negative for dysuria and hematuria.  Musculoskeletal:  Negative for arthralgias and back pain.  Skin:  Negative for color change and rash.  Neurological:  Negative for seizures, syncope and weakness.  Psychiatric/Behavioral:  Positive for dysphoric mood and hallucinations. Negative for self-injury and suicidal ideas.   All other systems reviewed and are negative.  Physical Exam Updated Vital Signs BP (!) 138/93 (BP Location: Left Arm)   Pulse 77   Temp 97.7 F (36.5 C)   Resp 18   Ht 5\' 9"  (1.753 m)   Wt 68 kg   SpO2 100%   BMI 22.15 kg/m   Physical Exam Vitals and nursing note reviewed.  Constitutional:      General: He is not in acute distress.    Appearance: Normal appearance. He is not toxic-appearing.  HENT:     Head: Normocephalic and atraumatic.     Right Ear: Tympanic membrane, ear canal and external ear normal.     Left Ear: Tympanic membrane, ear canal and external ear normal.     Nose: No congestion or rhinorrhea.     Mouth/Throat:     Mouth: Mucous membranes are moist.     Pharynx: No oropharyngeal exudate.  Eyes:     General: No scleral icterus. Cardiovascular:     Rate and Rhythm: Normal rate and regular rhythm.  Pulmonary:     Effort: Pulmonary effort is normal.     Breath sounds: Normal breath sounds.  Abdominal:     General: Abdomen is flat. Bowel sounds are normal.     Palpations: Abdomen is soft.  Musculoskeletal:         General: No deformity.     Cervical back: Normal range of motion.  Skin:    General: Skin is warm and dry.  Neurological:     General: No focal deficit present.     Mental Status: He is alert. Mental status is at baseline. He is disoriented.     Sensory: No sensory deficit.  Psychiatric:        Mood and Affect: Affect is inappropriate.        Speech: Speech is slurred and tangential.  Behavior: Behavior is not combative.        Thought Content: Thought content is delusional. Thought content does not include homicidal or suicidal ideation. Thought content does not include homicidal or suicidal plan.     Comments: Hyperreligious speech.  Tangential.  Speaking in third person.    ED Results / Procedures / Treatments   Labs (all labs ordered are listed, but only abnormal results are displayed) Labs Reviewed  RESP PANEL BY RT-PCR (FLU A&B, COVID) ARPGX2 - Abnormal; Notable for the following components:      Result Value   Influenza A by PCR POSITIVE (*)    All other components within normal limits  COMPREHENSIVE METABOLIC PANEL - Abnormal; Notable for the following components:   Glucose, Bld 104 (*)    AST 56 (*)    All other components within normal limits  RAPID URINE DRUG SCREEN, HOSP PERFORMED - Abnormal; Notable for the following components:   Tetrahydrocannabinol POSITIVE (*)    All other components within normal limits  CBC WITH DIFFERENTIAL/PLATELET  ETHANOL  RPR  HIV ANTIBODY (ROUTINE TESTING W REFLEX)  CBG MONITORING, ED    EKG EKG Interpretation  Date/Time:  Saturday July 02 2021 18:44:02 EST Ventricular Rate:  61 PR Interval:  190 QRS Duration: 108 QT Interval:  449 QTC Calculation: 453 R Axis:   91 Text Interpretation: Sinus rhythm Borderline right axis deviation ST elev, probable normal early repol pattern Confirmed by Kennis Carina [id number]) on 07/03/2021 5:23:53 PM  Radiology  Procedures Procedures   Medications Ordered in ED Medications   OLANZapine zydis (ZYPREXA) disintegrating tablet 10 mg (10 mg Oral Given 07/03/21 0339)  diphenhydrAMINE (BENADRYL) capsule 50 mg (50 mg Oral Given 07/03/21 9470)    ED Course  I have reviewed the triage vital signs and the nursing notes.  Pertinent labs & imaging results that were available during my care of the patient were reviewed by me and considered in my medical decision making (see chart for details).  20 year old male presents for AMS.  After interview with patient and grandma, this seems psychiatric in nature given that this is not acute, but has been gradually worsening over time.  Medical clearance order set placed.  TTS consulted.  EKG shows sinus rhythm.  CBC shows no leukocytosis or anemia present.  CMP shows no electrolyte abnormality.  Mildly hyperglycemic at 104.  Elevated AST at 56.  Urinalysis positive for THC.  CBG 98.  Patient is COVID-negative, flu positive.  Ethanol less than 10.   TTS recommended psych hold for psychiatric evaluation in the morning.  Psych hold order set placed.    MDM Rules/Calculators/A&P                          Final Clinical Impression(s) / ED Diagnoses Final diagnoses:  Delusional thoughts [facility])    Rx / DC Orders ED Discharge Orders     None        Achille Rich, PA-C 07/05/21 0232    Bethann Berkshire, MD 07/05/21 [phone]

## 2021-07-02 NOTE — BH Assessment (Signed)
Comprehensive Clinical Assessment (CCA) Note  07/02/2021 Marvin Marks [id number]  DISPOSITION: Gave clinical report to Marvin Asper, NP who recommended Pt be observed overnight and evaluated by psychiatry in the morning. Notified Dr. Bethann Marks, Marvin Rich, PA-C and Coral Ceo, RN of recommendation via secure message.  The patient demonstrates the following risk factors for suicide: Chronic risk factors for suicide include: N/A. Acute risk factors for suicide include: N/A. Protective factors for this patient include: positive social support, responsibility to others (children, family), hope for the future, and life satisfaction. Considering these factors, the overall suicide risk at this point appears to be low. Patient is appropriate for outpatient follow up.  Flowsheet Row ED from 07/02/2021 in [facility] EMERGENCY DEPARTMENT ED from 06/23/2021 in [facility] EMERGENCY DEPARTMENT  C-SSRS RISK CATEGORY No Risk No Risk      Pt is a 20 year old single male who presents unaccompanied to [facility] ED accompanied by his mother, who participated in assessment. Pt reports he came to the ED, "Because my soul has been pushed over, like a full glass of water on the edge of a counter." Pt presents with flight of ideas, loose associations, emotional lability, and at times give irrelevant responses to questions. [name] mother reports this change in mental status happened today. She says [name] birthday was yesterday, that he was out all night, and she is concerned "someone slipped him something." Pt reports he used marijuana yesterday and that he had only used once before a year ago. Mother says Pt was belligerent, agitated, very emotional, and talking out of his head. She says he has never presented with these symptoms before. She says [name] is starting to behave more normally compared to earlier today.  Pt acknowledges symptoms including crying spells, decreased concentration, fatigue, irritability,  decreased sleep, decreased appetite and anxiety. When asked Pt if he had any thought he wanted to die, Pt responds, "No, because I already seen it." He denies homicidal ideation, stating "How can I hurt someone when I can't hurt myself?" When asked if he is experiencing hallucinations, Pt says there are people who live in his head. He also say, "I thought a bunch of eagles came to my birthday but I think that was just in my head." Pt became very emotional when asked about family support and was loud and tearful. He denies use of substances other than using marijuana twice and infrequent social drinking.  Pt reports yesterday he witnessed his dog be hit by a car and killed. [name] mother says this did happen. Pt rambles, stating that his dog sacrificed himself, that God was in the dog and now God was in Pt. 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 He says he lives between his mother and his grandmother's homes. He denies history of abuse. He denies legal problems. He denies access to firearms. Pt denies any history of inpatient or outpatient psychiatric treatment.  Pt is dressed in hospital scrubs, alert and oriented to person and place. Pt speaks in a clear tone, at variable volume and normal pace. Motor behavior appears normal. Eye contact is good. [name] mood is euthymic, sad, irritable and affect is labile. [name] insight is poor. Judgment is impaired. Pt was cooperative throughout assessment. Pt has tested positive for influenza.    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 Family/Friend  Referral name: No data recorded Referral phone number: No data recorded  Whom do you see for routine medical problems? No data recorded Practice/Facility Name: No data  recorded Practice/Facility Phone Number: No data recorded Name of Contact: No data recorded Contact Number: No data recorded Contact Fax Number: No data recorded Prescriber Name: No data recorded Prescriber Address (if known): No data recorded  What Is the Reason for Your Visit/Call Today? [name] mother brought Pt to ED due to [name] unusual behavior. She reports starting today Pt has been agitated, belligerent, emotional, and saying things that do not make sense. She reports symptoms began today. Pt presents with flight of ideas, loose associations, emotional lability, and gives responses unrelated to questions asked.  How Long Has This Been Causing You Problems? <Week  What Do You Feel Would Help You the Most Today? Treatment for Depression or other mood problem   Have You Recently Been in Any Inpatient Treatment (Hospital/Detox/Crisis Center/28-Day Program)? No data recorded Name/Location of Program/Hospital:No data recorded How Long Were You There? No data recorded When Were You Discharged? No data recorded  Have You Ever Received Services From [facility] Before? No data recorded Who Do You See at [facility]? No data recorded  Have You Recently Had Any Thoughts About Hurting Yourself? No  Are You Planning to Commit Suicide/Harm Yourself At This time? No   Have you Recently Had Thoughts About [place]? No  Explanation: No data recorded  Have You Used Any Alcohol or Drugs in the Past 24 Hours? Yes  How Long Ago Did You Use Drugs or Alcohol? No data recorded What Did You Use and How Much? Pt reports using an unknown quantity of marijuana yesterday.   Do You Currently Have a Therapist/Psychiatrist? No  Name of Therapist/Psychiatrist: No data recorded  Have You Been Recently Discharged From Any Office Practice or Programs? No  Explanation of Discharge From Practice/Program: No data recorded    CCA Screening Triage Referral Assessment Type of Contact:  Tele-Assessment  Is this Initial or Reassessment? Initial Assessment  Date Telepsych consult ordered in [facility]:  07/02/21  Time Telepsych consult ordered in [facility]:  [place]   Patient Reported Information Reviewed? No data recorded Patient Left Without Being Seen? No data recorded Reason for Not Completing Assessment: No data recorded  Collateral Involvement: [name] mother:   Does Patient Have a Court Appointed Legal Guardian? No data recorded Name and Contact of Legal Guardian: No data recorded If Minor and Not Living with Parent(s), Who has Custody? No data recorded Is CPS involved or ever been involved? Never  Is APS involved or ever been involved? Never   Patient Determined To Be At Risk for Harm To Self or Others Based on Review of Patient Reported Information or Presenting Complaint? No  Method: No data recorded Availability of Means: No data recorded Intent: No data recorded Notification Required: No data recorded Additional Information for Danger to Others Potential: No data recorded Additional Comments for Danger to Others Potential: No data recorded Are There Guns or Other Weapons in Your Home? No data recorded Types of Guns/Weapons: No data recorded Are These Weapons Safely Secured?                            No data recorded Who Could Verify You Are Able To Have These Secured: No data recorded Do You Have  any Outstanding Charges, Pending Court Dates, Parole/Probation? No data recorded Contacted To Inform of Risk of Harm To Self or Others: No data recorded  Location of Assessment: [facility]   Does Patient Present under Involuntary Commitment? No  IVC Papers Initial File Date: No data recorded  [place] of Residence: [place]   Patient Currently Receiving the Following Services: Not Receiving Services   Determination of Need: Urgent (48 hours)   Options For Referral: Inpatient Hospitalization; Medication Management; Outpatient Therapy     CCA  Biopsychosocial Intake/Chief Complaint:  No data recorded Current Symptoms/Problems: No data recorded  Patient Reported Schizophrenia/Schizoaffective Diagnosis in Past: No   Strengths: Pt articulates his feelings and has good family support  Preferences: No data recorded Abilities: No data recorded  Type of Services Patient Feels are Needed: No data recorded  Initial Clinical Notes/Concerns: No data recorded  Mental Health Symptoms Depression:   Change in energy/activity; Difficulty Concentrating; Increase/decrease in appetite; Irritability; Tearfulness   Duration of Depressive symptoms:  Less than two weeks   Mania:   Change in energy/activity; Irritability   Anxiety:    Difficulty concentrating; Irritability; Tension; Worrying   Psychosis:   Delusions   Duration of Psychotic symptoms:  Less than six months   Trauma:   None   Obsessions:   None   Compulsions:   None   Inattention:   None   Hyperactivity/Impulsivity:   None   Oppositional/Defiant Behaviors:   None   Emotional Irregularity:   None   Other Mood/Personality Symptoms:   NA    Mental Status Exam Appearance and self-care  Stature:   Average   Weight:   Average weight   Clothing:   Casual   Grooming:   Normal   Cosmetic use:   None   Posture/gait:   Normal   Motor activity:   Not Remarkable   Sensorium  Attention:   Distractible   Concentration:   Variable   Orientation:   Person; Place; Situation   Recall/memory:   Normal   Affect and Mood  Affect:   Labile   Mood:   Anxious; Irritable; Depressed   Relating  Eye contact:   Normal   Facial expression:   Responsive   Attitude toward examiner:   Cooperative   Thought and Language  Speech flow:  Flight of Ideas   Thought content:   Delusions   Preoccupation:   None   Hallucinations:   None   Organization:  No data recorded  Transport planner of Knowledge:   Average    Intelligence:   Average   Abstraction:   Overly abstract   Judgement:   Impaired   Reality Testing:   Distorted   Insight:   Gaps   Decision Making:   Vacilates   Social Functioning  Social Maturity:   Responsible   Social Judgement:   Normal   Stress  Stressors:   School   Coping Ability:   Normal   Skill Deficits:   None   Supports:   Family; Friends/Service system     Religion: Religion/Spirituality Are You A Religious Person?: Yes What is Your Religious Affiliation?: Unknown How Might This Affect Treatment?: NA  Leisure/Recreation: Leisure / Recreation Do You Have Hobbies?: No  Exercise/Diet: Exercise/Diet Do You Exercise?: Yes What Type of Exercise Do You Do?: Run/Walk How Many Times a Week Do You Exercise?: 1-3 times a week Have You Gained or Lost A Significant Amount of Weight in the Past Six Months?:  Yes-Lost Number of Pounds Lost?:  (Unknown) Do You Follow a Special Diet?: No Do You Have Any Trouble Sleeping?: Yes Explanation of Sleeping Difficulties: Pt reports reduced sleep recently   CCA Employment/Education Employment/Work Situation: Employment / Work Situation Employment Situation: Radio broadcast assistant Job has Been Impacted by Current Illness: No Has Patient ever Been in the [employer]?: No  Education: Education Is Patient Currently Attending School?: Yes School Currently Attending: [employer] Did Physicist, medical?: Yes What Type of College Degree Do you Have?: Freshman year of college Did You Have An Individualized Education Program (IIEP): No Did You Have Any Difficulty At [employer]?: No Patient's Education Has Been Impacted by Current Illness: No   CCA Family/Childhood History Family and Relationship History: Family history Marital status: Single Does patient have children?: No  Childhood History:  Childhood History By whom was/is the patient raised?: Mother Did patient suffer any  verbal/emotional/physical/sexual abuse as a child?: No Did patient suffer from severe childhood neglect?: No Has patient ever been sexually abused/assaulted/raped as an adolescent or adult?: No Was the patient ever a victim of a crime or a disaster?: No Witnessed domestic violence?: No Has patient been affected by domestic violence as an adult?: No  Child/Adolescent Assessment:     CCA Substance Use Alcohol/Drug Use: Alcohol / Drug Use Pain Medications: Denies abuse Prescriptions: Denies abuse Over the Counter: Denies abuse History of alcohol / drug use?: Yes (Pt reports using marijuana yesterday and 1 year ago)                         ASAM's:  Six Dimensions of Multidimensional Assessment  Dimension 1:  Acute Intoxication and/or Withdrawal Potential:      Dimension 2:  Biomedical Conditions and Complications:      Dimension 3:  Emotional, Behavioral, or Cognitive Conditions and Complications:     Dimension 4:  Readiness to Change:     Dimension 5:  Relapse, Continued use, or Continued Problem Potential:     Dimension 6:  Recovery/Living Environment:     ASAM Severity Score:    ASAM Recommended Level of Treatment:     Substance use Disorder (SUD)    Recommendations for Services/Supports/Treatments:    DSM5 Diagnoses: Patient Active Problem List   Diagnosis Date Noted   Perennial allergic rhinitis 08/02/2018   Mild persistent asthma, uncomplicated [id number]   Headache(784.0) 10/06/2013   Unspecified asthma(493.90) 01/27/2013   GE reflux 02/19/2012    Patient Centered Plan: Patient is on the following Treatment Plan(s):  Substance Abuse   Referrals to Alternative Service(s): Referred to Alternative Service(s):   Place:   Date:   Time:    Referred to Alternative Service(s):   Place:   Date:   Time:    Referred to Alternative Service(s):   Place:   Date:   Time:    Referred to Alternative Service(s):   Place:   Date:   Time:     Evelena Peat, [facility]

## 2021-07-02 NOTE — ED Notes (Signed)
Patient's TTS completed.  NP recommends that the patient be observed overnight and evaluated in the morning by psych.

## 2021-07-02 NOTE — ED Notes (Signed)
Patient changed into scrubs, belongings placed in locker.

## 2021-07-02 NOTE — ED Triage Notes (Signed)
[name] mother is very concern with her son. She states he is not acting like himself. Pt stated he smoked weed this morning. [name] mother states he doesn't normally act like with with smoking.

## 2021-07-03 ENCOUNTER — Emergency Department (HOSPITAL_COMMUNITY): Payer: Medicaid Other

## 2021-07-03 DIAGNOSIS — F1290 Cannabis use, unspecified, uncomplicated: Secondary | ICD-10-CM

## 2021-07-03 DIAGNOSIS — F22 Delusional disorders: Secondary | ICD-10-CM | POA: Diagnosis not present

## 2021-07-03 DIAGNOSIS — R4182 Altered mental status, unspecified: Secondary | ICD-10-CM | POA: Diagnosis not present

## 2021-07-03 LAB — HIV ANTIBODY (ROUTINE TESTING W REFLEX): HIV Screen 4th Generation wRfx: NONREACTIVE

## 2021-07-03 MED ORDER — OLANZAPINE 5 MG PO TBDP: 5.0000 mg | ORAL_TABLET | Freq: Three times a day (TID) | ORAL | Status: DC | PRN

## 2021-07-03 MED ORDER — OLANZAPINE 5 MG PO TBDP
10.0000 mg | ORAL_TABLET | ORAL | Status: AC
  Administered 2021-07-03: 10 mg via ORAL
  Filled 2021-07-03: qty 2

## 2021-07-03 MED ORDER — DIPHENHYDRAMINE HCL 25 MG PO CAPS
50.0000 mg | ORAL_CAPSULE | Freq: Once | ORAL | Status: AC
  Administered 2021-07-03: 50 mg via ORAL
  Filled 2021-07-03: qty 2

## 2021-07-03 MED ORDER — OLANZAPINE 5 MG PO TBDP
10.0000 mg | ORAL_TABLET | Freq: Every day | ORAL | Status: DC
  Administered 2021-07-03 – 2021-07-04 (×2): 10 mg via ORAL
  Filled 2021-07-03 (×2): qty 2

## 2021-07-03 MED ORDER — ZIPRASIDONE MESYLATE 20 MG IM SOLR: 20.0000 mg | INTRAMUSCULAR | Status: DC | PRN

## 2021-07-03 MED ORDER — LORAZEPAM 1 MG PO TABS: 1.0000 mg | ORAL_TABLET | ORAL | Status: DC | PRN

## 2021-07-03 NOTE — Progress Notes (Addendum)
Per Dimple Casey secure chat, patient meets criteria for inpatient treatment. There are no available or appropriate beds at [facility] today. CSW faxed referrals to the following facilities for review:  [facility]  Pending - No Request Sent N/A [address], [place] [address] [phone] --  [facility]  Pending - No Request Sent N/A [address] [place] [address] [phone] --  [facility]  Pending - No Request Sent N/A [address] [place] [address] [phone] --  [facility]  Pending - No Request Sent N/A [address], [place] [address] [phone] --  [facility]  Pending - No Request Sent N/A [address] [place], [place] [address] [phone] --  [facility]  Pending - No Request Sent N/A [address] [place] [address] [phone] --  [facility]  Pending - No Request Sent N/A [address] [place] [address] [phone] --  [facility]  Pending - No Request Sent N/A [address] [place] [address] [phone] --  [facility]  Pending - No Request Sent N/A [address] [place] [address] [phone] --  [facility]  Pending - No Request Sent N/A [address] [place] [address] [phone] --  [facility]  Pending - No Request Sent N/A [address] [place] [address] [phone] --  [facility]  Pending - No Request Sent N/A [address], [place] [address] [phone] --  [facility]  Pending - No Request Sent N/A [address] [place] [address] [phone]  --    TTS will continue to seek bed placement.  Marvin Marks, MSW, LCSW-A, LCAS-A Phone: [phone] Disposition/TOC

## 2021-07-03 NOTE — ED Notes (Signed)
Pt asked to speak to this RN, pt states "God woke me up, he told me I need to put money on the table. I need a job" Pt states he feels disoriented and that "his brain is tired. Pt wanting to walk around ED, explained to pt that he has the flu and should stay in his room. Pt continues  to come out into hall without mask on. EDP made aware.

## 2021-07-03 NOTE — ED Notes (Signed)
Pt calling his uncle
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
## 2021-07-03 NOTE — ED Provider Notes (Signed)
Patient becoming slightly agitated.  He cannot sleep.  He is asking to walk around in the department.  He is somewhat hyperreligious, appears delusional, somewhat paranoid.  Will sedate.   Gilda Crease, MD 07/03/21 [phone]

## 2021-07-03 NOTE — ED Notes (Signed)
Pt in shower.  

## 2021-07-03 NOTE — ED Notes (Signed)
TTS at this time. 

## 2021-07-03 NOTE — ED Provider Notes (Signed)
Emergency Medicine Observation Re-evaluation Note  Marvin Marks is a 20 y.o. male, seen on rounds today.  Pt initially presented to the ED for complaints of Fatigue Currently, the patient is resting.  Physical Exam  BP (!) 146/86   Pulse 92   Temp 99.8 F (37.7 C) (Oral)   Resp 16   Ht 5\' 9"  (1.753 m)   Wt 68 kg   SpO2 100%   BMI 22.15 kg/m  Physical Exam General: resting comfortably, NAD Lungs: normal WOB Psych: currently calm and resting  ED Course / MDM  EKG:   I have reviewed the labs performed to date as well as medications administered while in observation.  Recent changes in the last 24 hours include none.  Plan  Current plan is for inpatient psychiatric placement.  Marvin Marks is not under involuntary commitment.     Paula Compton, DO 07/03/21 1506

## 2021-07-04 DIAGNOSIS — F22 Delusional disorders: Secondary | ICD-10-CM | POA: Diagnosis not present

## 2021-07-04 DIAGNOSIS — F19959 Other psychoactive substance use, unspecified with psychoactive substance-induced psychotic disorder, unspecified: Secondary | ICD-10-CM

## 2021-07-04 LAB — RPR: RPR Ser Ql: NONREACTIVE

## 2021-07-04 NOTE — Consult Note (Addendum)
Telepsych Consultation   Reason for Consult:  psychiatric evaluation Referring Physician:  Dr. Rhunette Croft, EDP Location of Patient: [facility] Location of Provider: [facility]  Patient Identification: Marvin Marks MRN:  [id number] Principal Diagnosis: Substance-induced psychotic disorder [facility]) Diagnosis:  Principal Problem:   Substance-induced psychotic disorder (HCC) Active Problems:   Marijuana use   Total Time spent with patient: 30 minutes  Subjective:   Marvin Marks is a 20 y.o. male patient admitted with per TTS assessment on 11/19:   Pt is a 20 year old single male who presents unaccompanied to [facility] ED accompanied by his mother, who participated in assessment. Pt reports he came to the ED, "Because my soul has been pushed over, like a full glass of water on the edge of a counter." Pt presents with flight of ideas, loose associations, emotional lability, and at times give irrelevant responses to questions. [name] mother reports this change in mental status happened today. She says [name] birthday was yesterday, that he was out all night, and she is concerned "someone slipped him something." Pt reports he used marijuana yesterday and that he had only used once before a year ago. Mother says Pt was belligerent, agitated, very emotional, and talking out of his head. She says he has never presented with these symptoms before. She says [name] is starting to behave more normally compared to earlier today.  Pt reports yesterday he witnessed his dog get hit by a car and killed. [name] mother says this did happen. Pt rambles, stating that his dog sacrificed himself, that God was in the dog and now God was in Pt. 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 He says he lives between his mother and his grandmother's homes. He denies history of abuse. He denies legal problems. He denies access to firearms. Pt denies any history of inpatient or outpatient psychiatric  treatment.  Marland Kitchen  HPI:  Marvin Marks is 20 year old male seen by this provider via tele psych at [facility], case reviewed with Dr. Lucianne Muss on 07/04/21.  Patient is sitting on stretcher, alert and oriented x 4, cooperative with good eye contact. When asked why he is in the emergency department, he replies "I was out walking and saw my dog get hit by a car and it traumatized my brain". When asked about how things were prior to his dog getting killed, he replied, "things were great, it was my birthday, Melida Quitter is coming up and I was happy". He reports that he was not aware that he had the flu and has been tired and thinking it was his allergies acting up. Reports he is taking classes at [employer], his grades are okay, still adjusting to college life. Hobbies include: getting out in nature, lifting weights, and reading which is his newest hobby, likes non-fiction. "I feel much better now and choose to focus on feeling better". "I am happy and should focus on the present".   Per chart review, this patient was speaking in the 3 rd person, experiencing thought blocking, and having hyper religious thoughts. None of this present today. He was able to answer all questions, show insight, and have forward thinking throughout the interview. He is friendly, polite,  and frequently smiles with provider. He is alert and oriented x 4 and appropriate in all responses to this providers questions.     Per ED staff: patient has not exhibited delusional thoughts in past 24 hours, last documentation of such was on 11/20  at 0320. RN reports he has been doing well.   Denies suicidal ideation, never had thoughts of suicide, never had suicide attempt, denies homicidal ideation, denies auditory and visual hallucinations. Does not appear to be responding to internal or external stimuli. Able to contract for safety.   Lives with grandmother, considers his support person, his mother. Last seen at [facility] July 2022. Head Ct done 11/20:  negative for acute findings.   Denies substance and alcohol use: UDS positive for THC, when questioned about using marijuana he reports, "I don't do stuff like that". He had admitted to smoking marijuana in an earlier assessment.   Past Psychiatric History: none  Risk to Self:  no Risk to Others:  no Prior Inpatient Therapy:  no Prior Outpatient Therapy:  no  Past Medical History:  Past Medical History:  Diagnosis Date   Allergic rhinoconjunctivitis    Asthma    Gastroesophageal reflux    Unspecified asthma(493.90) 01/27/2013    Past Surgical History:  Procedure Laterality Date   ADENOIDECTOMY     MYRINGOTOMY WITH TUBE PLACEMENT     TONSILLECTOMY     Family History:  Family History  Problem Relation Age of Onset   Asthma Father    Asthma Maternal Grandmother    Allergic rhinitis Neg Hx    Angioedema Neg Hx    Eczema Neg Hx    Immunodeficiency Neg Hx    Urticaria Neg Hx    Atopy Neg Hx    Family Psychiatric  History: unknown Social History:  Social History   Substance and Sexual Activity  Alcohol Use No     Social History   Substance and Sexual Activity  Drug Use Yes   Types: Marijuana    Social History   Socioeconomic History   Marital status: Single    Spouse name: Not on file   Number of children: Not on file   Years of education: Not on file   Highest education level: Not on file  Occupational History   Not on file  Tobacco Use   Smoking status: Never   Smokeless tobacco: Never  Vaping Use   Vaping Use: Never used  Substance and Sexual Activity   Alcohol use: No   Drug use: Yes    Types: Marijuana   Sexual activity: Yes    Birth control/protection: Condom    Comment: Heterosexual  Other Topics Concern   Not on file  Social History Narrative   5th grade 2014-2015   Social Determinants of Health   Financial Resource Strain: Not on file  Food Insecurity: Not on file  Transportation Needs: Not on file  Physical Activity: Not on file   Stress: Not on file  Social Connections: Not on file   Additional Social History:    Allergies:  No Known Allergies  Labs:  Results for orders placed or performed during the hospital encounter of 07/02/21 (from the past 48 hour(s))  Comprehensive metabolic panel     Status: Abnormal   Collection Time: 07/02/21  4:58 PM  Result Value Ref Range   Sodium 136 135 - 145 mmol/L   Potassium 3.9 3.5 - 5.1 mmol/L   Chloride 100 98 - 111 mmol/L   CO2 25 22 - 32 mmol/L   Glucose, Bld 104 (H) 70 - 99 mg/dL    Comment: Glucose reference range applies only to samples taken after fasting for at least 8 hours.   BUN 19 6 - 20 mg/dL   Creatinine, Ser 0.03  0.61 - 1.24 mg/dL   Calcium 9.2 8.9 - 22.9 mg/dL   Total Protein 8.1 6.5 - 8.1 g/dL   Albumin 4.9 3.5 - 5.0 g/dL   AST 56 (H) 15 - 41 U/L   ALT 26 0 - 44 U/L   Alkaline Phosphatase 52 38 - 126 U/L   Total Bilirubin 0.8 0.3 - 1.2 mg/dL   GFR, Estimated >79 >89 mL/min    Comment: (NOTE) Calculated using the CKD-EPI Creatinine Equation (2021)    Anion gap 11 5 - 15    Comment: Performed at [facility], [address], [place], [place] [address]  CBC with Diff     Status: None   Collection Time: 07/02/21  4:58 PM  Result Value Ref Range   WBC 7.5 4.0 - 10.5 K/uL   RBC 5.11 4.22 - 5.81 MIL/uL   Hemoglobin 15.6 13.0 - 17.0 g/dL   HCT 17.4 08.1 - 44.8 %   MCV 86.7 80.0 - 100.0 fL   MCH 30.5 26.0 - 34.0 pg   MCHC 35.2 30.0 - 36.0 g/dL   RDW 18.5 63.1 - 49.7 %   Platelets 240 150 - 400 K/uL   nRBC 0.0 0.0 - 0.2 %   Neutrophils Relative % 80 %   Neutro Abs 6.1 1.7 - 7.7 K/uL   Lymphocytes Relative 11 %   Lymphs Abs 0.9 0.7 - 4.0 K/uL   Monocytes Relative 8 %   Monocytes Absolute 0.6 0.1 - 1.0 K/uL   Eosinophils Relative 0 %   Eosinophils Absolute 0.0 0.0 - 0.5 K/uL   Basophils Relative 0 %   Basophils Absolute 0.0 0.0 - 0.1 K/uL   Immature Granulocytes 1 %   Abs Immature Granulocytes 0.04 0.00 - 0.07 K/uL    Comment: Performed at  [facility], [address] [place], [place] [address]  Urine rapid drug screen (hosp performed)     Status: Abnormal   Collection Time: 07/02/21  6:03 PM  Result Value Ref Range   Opiates NONE DETECTED NONE DETECTED   Cocaine NONE DETECTED NONE DETECTED   Benzodiazepines NONE DETECTED NONE DETECTED   Amphetamines NONE DETECTED NONE DETECTED   Tetrahydrocannabinol POSITIVE (A) NONE DETECTED   Barbiturates NONE DETECTED NONE DETECTED    Comment: (NOTE) DRUG SCREEN FOR MEDICAL PURPOSES ONLY.  IF CONFIRMATION IS NEEDED FOR ANY PURPOSE, NOTIFY LAB WITHIN 5 DAYS.  LOWEST DETECTABLE LIMITS FOR URINE DRUG SCREEN Drug Class                     Cutoff (ng/mL) Amphetamine and metabolites    1000 Barbiturate and metabolites    200 Benzodiazepine                 200 Tricyclics and metabolites     300 Opiates and metabolites        300 Cocaine and metabolites        300 THC                            50 Performed at [facility], [address], [place], [place] [address]   Resp Panel by RT-PCR (Flu A&B, Covid) Nasopharyngeal Swab     Status: Abnormal   Collection Time: 07/02/21  6:06 PM   Specimen: Nasopharyngeal Swab; Nasopharyngeal(NP) swabs in vial transport medium  Result Value Ref Range   SARS Coronavirus 2 by RT PCR NEGATIVE NEGATIVE    Comment: (NOTE)  SARS-CoV-2 target nucleic acids are NOT DETECTED.  The SARS-CoV-2 RNA is generally detectable in upper respiratory specimens during the acute phase of infection. The lowest concentration of SARS-CoV-2 viral copies this assay can detect is 138 copies/mL. A negative result does not preclude SARS-Cov-2 infection and should not be used as the sole basis for treatment or other patient management decisions. A negative result may occur with  improper specimen collection/handling, submission of specimen other than nasopharyngeal swab, presence of viral mutation(s) within the areas targeted by this assay, and inadequate number of  viral copies(<138 copies/mL). A negative result must be combined with clinical observations, patient history, and epidemiological information. The expected result is Negative.  Fact Sheet for Patients:  BloggerCourse.com  Fact Sheet for Healthcare Providers:  SeriousBroker.it  This test is no t yet approved or cleared by the [place] FDA and  has been authorized for detection and/or diagnosis of SARS-CoV-2 by FDA under an Emergency Use Authorization (EUA). This EUA will remain  in effect (meaning this test can be used) for the duration of the COVID-19 declaration under Section 564(b)(1) of the Act, 21 U.S.C.section 360bbb-3(b)(1), unless the authorization is terminated  or revoked sooner.       Influenza A by PCR POSITIVE (A) NEGATIVE   Influenza B by PCR NEGATIVE NEGATIVE    Comment: (NOTE) The Xpert Xpress SARS-CoV-2/FLU/RSV plus assay is intended as an aid in the diagnosis of influenza from Nasopharyngeal swab specimens and should not be used as a sole basis for treatment. Nasal washings and aspirates are unacceptable for Xpert Xpress SARS-CoV-2/FLU/RSV testing.  Fact Sheet for Patients: BloggerCourse.com  Fact Sheet for Healthcare Providers: SeriousBroker.it  This test is not yet approved or cleared by the [place] FDA and has been authorized for detection and/or diagnosis of SARS-CoV-2 by FDA under an Emergency Use Authorization (EUA). This EUA will remain in effect (meaning this test can be used) for the duration of the COVID-19 declaration under Section 564(b)(1) of the Act, 21 U.S.C. section 360bbb-3(b)(1), unless the authorization is terminated or revoked.  Performed at [facility], [address], [place], [place] [address]   CBG monitoring, ED     Status: None   Collection Time: 07/02/21  6:15 PM  Result Value Ref Range   Glucose-Capillary 98 70 - 99  mg/dL    Comment: Glucose reference range applies only to samples taken after fasting for at least 8 hours.  Ethanol     Status: None   Collection Time: 07/02/21  6:26 PM  Result Value Ref Range   Alcohol, Ethyl (B) <10 <10 mg/dL    Comment: (NOTE) Lowest detectable limit for serum alcohol is 10 mg/dL.  For medical purposes only. Performed at [facility], [address] [place], [place] [address]   HIV Antibody (routine testing w rflx)     Status: None   Collection Time: 07/03/21 10:30 AM  Result Value Ref Range   HIV Screen 4th Generation wRfx Non Reactive Non Reactive    Comment: Performed at [facility], [address] [place], [place] [address]    Medications:  Current Facility-Administered Medications  Medication Dose Route Frequency Provider Last Rate Last Admin   OLANZapine zydis (ZYPREXA) disintegrating tablet 5 mg  5 mg Oral Q8H PRN Leevy-Johnson, Brooke A, NP       And   LORazepam (ATIVAN) tablet 1 mg  1 mg Oral PRN Leevy-Johnson, Brooke A, NP       And   ziprasidone (  GEODON) injection 20 mg  20 mg Intramuscular PRN Leevy-Johnson, Brooke A, NP       OLANZapine zydis (ZYPREXA) disintegrating tablet 10 mg  10 mg Oral Daily Leevy-Johnson, Brooke A, NP   10 mg at 07/03/21 1206   Current Outpatient Medications  Medication Sig Dispense Refill   albuterol (PROAIR HFA) 108 (90 Base) MCG/ACT inhaler Inhale 4 puffs into the lungs every 6 (six) hours as needed for wheezing or shortness of breath. 18 g 11   cetirizine (ZYRTEC) 10 MG tablet Take 1 tablet (10 mg total) by mouth 2 (two) times daily. TAKE ONE TABLET (10 MG TOTAL) BY MOUTH DAILY. 60 tablet 11   fluticasone (FLONASE) 50 MCG/ACT nasal spray Place 2 sprays into both nostrils daily. 16 g 11   montelukast (SINGULAIR) 10 MG tablet TAKE ONE TABLET (10 MG TOTAL) BY MOUTH AT BEDTIME. 30 tablet 11   Olopatadine HCl (PATADAY) 0.2 % SOLN Apply 1 drop to eye daily as needed (FOR ALLERGIES). 2.5 mL 11   doxycycline  (VIBRA-TABS) 100 MG tablet Take 100 mg by mouth 2 (two) times daily. (Patient not taking: Reported on 07/02/2021)     fluticasone (FLOVENT HFA) 110 MCG/ACT inhaler INHALE ONE PUFF INTO THE LUNGS TWO TIMES DAILY FOR 2 WEEKS (Patient not taking: Reported on 07/02/2021) 12 g 11   Melatonin 3 MG TABS Take 1 tablet by mouth at bedtime. (Patient not taking: Reported on 07/02/2021)     triamcinolone (KENALOG) 0.025 % ointment APPLY TWO TIMES DAILY (Patient not taking: Reported on 07/02/2021) 30 g 0    Musculoskeletal: Strength & Muscle Tone: within normal limits Gait & Station: normal Patient leans: N/A    Psychiatric Specialty Exam:  Presentation  General Appearance: Appropriate for Environment  Eye Contact:Good  Speech:Clear and Coherent; Normal Rate  Speech Volume:Normal  Handedness:No data recorded  Mood and Affect  Mood:Euthymic  Affect:Congruent   Thought Process  Thought Processes:Coherent; Goal Directed  Descriptions of Associations:Intact  Orientation:Full (Time, Place and Person)  Thought Content:Logical  History of Schizophrenia/Schizoaffective disorder:No  Duration of Psychotic Symptoms:N/A  Hallucinations:Hallucinations: None  Ideas of Reference:None  Suicidal Thoughts:Suicidal Thoughts: No  Homicidal Thoughts:Homicidal Thoughts: No   Sensorium  Memory:Immediate Good; Recent Good; Remote Good  Judgment:Good  Insight:Good   Executive Functions  Concentration:Good  Attention Span:Good  Recall:Good  Fund of Knowledge:Good  Language:Good   Psychomotor Activity  Psychomotor Activity:Psychomotor Activity: Normal   Assets  Assets:Communication Skills; Desire for Improvement; Financial Resources/Insurance; Housing; Leisure Time; Physical Health; Resilience; Social Support; Transportation; Vocational/Educational   Sleep  Sleep:Sleep: Good Number of Hours of Sleep: 8    Physical Exam: Physical Exam Vitals reviewed.   Constitutional:      Appearance: Normal appearance.  Cardiovascular:     Rate and Rhythm: Bradycardia present.  Pulmonary:     Effort: Pulmonary effort is normal.  Musculoskeletal:        General: Normal range of motion.  Neurological:     Mental Status: He is alert and oriented to person, place, and time.  Psychiatric:        Attention and Perception: Attention normal. He does not perceive auditory or visual hallucinations.        Mood and Affect: Mood normal.        Speech: Speech normal.        Behavior: Behavior normal. Behavior is cooperative.        Thought Content: Thought content is not paranoid or delusional. Thought content does not include homicidal or  suicidal ideation. Thought content does not include homicidal or suicidal plan.        Cognition and Memory: Cognition normal.        Judgment: Judgment normal.   Review of Systems  Constitutional:  Positive for fever (influenza infection in the past week).  Respiratory:  Positive for cough. Negative for shortness of breath.   Cardiovascular:  Negative for chest pain.  Gastrointestinal:  Negative for abdominal pain, nausea and vomiting.  Neurological:  Negative for weakness and headaches.  Psychiatric/Behavioral:  Positive for substance abuse. Negative for depression, hallucinations and suicidal ideas. The patient is not nervous/anxious and does not have insomnia.   Blood pressure (!) 104/59, pulse (!) 50, temperature 98.7 F (37.1 C), temperature source Oral, resp. rate 16, height 5\' 9"  (1.753 m), weight 68 kg, SpO2 100 %. Body mass index is 22.15 kg/m.  Treatment Plan Summary: Plan:   Psychiatrically cleared with resources provided. Safe for outpatient treatment. Safety planning reviewed.  Discussed seeing a counselor to address the trauma of losing his dog, states he wants to think about it and agrees to receive information at discharge. Encouraged patient to follow-up at [facility].   Disposition: No evidence of imminent  risk to self or others at present.   Patient does not meet criteria for psychiatric inpatient admission. Supportive therapy provided about ongoing stressors. Discussed crisis plan, support from social network, calling 911, coming to the Emergency Department, and calling Suicide Hotline.  Attempted to notify grandmother,  of this disposition, [phone], no answer, unable to leave a message, due to voice mail not set up yet.   EDP, ED staff, CSW notified of disposition via secure chat. AVS updated per CSW.   This service was provided via telemedicine using a 2-way, interactive audio and video technology.  Names of all persons participating in this telemedicine service and their role in this encounter. Name: Marvin Marks Role: patient  Name: Omar Person Role: NP  Name: Dorena Bodo Role: MD/psychiatrist    Nelly Rout, NP 07/04/2021 10:43 AM

## 2021-07-04 NOTE — ED Provider Notes (Signed)
Emergency Medicine Observation Re-evaluation Note  Marvin Marks is a 20 y.o. male, seen on rounds today.  Pt initially presented to the ED for complaints of Fatigue Currently, the patient is in no acute distress, cooperative.  Physical Exam  BP (!) 138/93 (BP Location: Left Arm)   Pulse 77   Temp 97.7 F (36.5 C)   Resp 18   Ht 5\' 9"  (1.753 m)   Wt 68 kg   SpO2 100%   BMI 22.15 kg/m  Physical Exam General: No acute distress Cardiac: Regular rate Lungs: No respiratory distress Psych: Cooperative  ED Course / MDM  EKG:EKG Interpretation  Date/Time:  Saturday July 02 2021 18:44:02 EST Ventricular Rate:  61 PR Interval:  190 QRS Duration: 108 QT Interval:  449 QTC Calculation: 453 R Axis:   91 Text Interpretation: Sinus rhythm Borderline right axis deviation ST elev, probable normal early repol pattern Confirmed by 06-07-1969 [phone]) on 07/03/2021 5:23:53 PM  I have reviewed the labs performed to date as well as medications administered while in observation.  Recent changes in the last 24 hours include : None.  Plan  Current plan is for patient to be discharged.  Psychiatry team has cleared the patient they want him to get [facility] resources.  I have requested social work/psych team to update the family since they have more information on patient's diagnosis and care.  Marvin Marks is not under involuntary commitment.     Paula Compton, MD 07/04/21 1058

## 2021-07-04 NOTE — Discharge Instructions (Addendum)
Please follow-up with the [facility] services as requested.

## 2021-07-05 ENCOUNTER — Telehealth: Payer: Self-pay

## 2021-07-05 NOTE — Telephone Encounter (Signed)
Transition Care Management Unsuccessful Follow-up Telephone Call  Date of discharge and from where:  07/04/2021 from [facility]  Attempts:  1st Attempt  Reason for unsuccessful TCM follow-up call:  Left voice message

## 2021-07-06 DIAGNOSIS — F29 Unspecified psychosis not due to a substance or known physiological condition: Secondary | ICD-10-CM | POA: Diagnosis not present

## 2021-07-06 NOTE — Telephone Encounter (Signed)
Transition Care Management Unsuccessful Follow-up Telephone Call  Date of discharge and from where:  07/04/2021 from [facility]  Attempts:  2nd Attempt  Reason for unsuccessful TCM follow-up call:  Left voice message

## 2021-07-08 NOTE — Telephone Encounter (Signed)
Transition Care Management Unsuccessful Follow-up Telephone Call  Date of discharge and from where:  07/04/2021-[facility]   Attempts:  3rd Attempt 3rd Attempt Reason for unsuccessful TCM follow-up call:  Left voice message

## 2021-07-14 DIAGNOSIS — F4320 Adjustment disorder, unspecified: Secondary | ICD-10-CM | POA: Diagnosis not present

## 2021-07-22 NOTE — Telephone Encounter (Addendum)
Pt calling this am b/c he got a text he needs follow up with his pcp.  I see you have been trying to get in touch wit him.  His best number is [phone].  Demographics havebeen updated

## 2021-07-25 ENCOUNTER — Ambulatory Visit
Admission: EM | Admit: 2021-07-25 | Discharge: 2021-07-25 | Disposition: A | Discharge status: Home / Self Care | Payer: Medicaid Other | Attending: Family Medicine | Admitting: Family Medicine

## 2021-07-25 ENCOUNTER — Other Ambulatory Visit: Payer: Self-pay

## 2021-07-25 DIAGNOSIS — R109 Unspecified abdominal pain: Secondary | ICD-10-CM

## 2021-07-25 DIAGNOSIS — R197 Diarrhea, unspecified: Secondary | ICD-10-CM

## 2021-07-25 MED ORDER — DICYCLOMINE HCL 20 MG PO TABS: 20.0000 mg | ORAL_TABLET | Freq: Three times a day (TID) | ORAL | 0 refills | Status: DC | PRN

## 2021-07-25 NOTE — ED Provider Notes (Signed)
[facility]    CSN: [id number] Arrival date & time: 07/25/21  1147      History   Chief Complaint Chief Complaint  Patient presents with   Diarrhea    HPI Marvin Marks is a 20 y.o. male.   Presenting today with several week history of several episodes of diarrhea, abdominal cramping.  Does not seem to be associated with nausea, vomiting, weight loss, bloody stools, upper respiratory symptoms, certain foods, certain situations.  He states he was googling his symptoms and he is concerned about a tapeworm.  Wanting to be checked for this.  Does have significant stressors in his life.  Not trying anything over-the-counter for symptoms.  No known chronic GI issues.   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 Authorizing Provider  dicyclomine (BENTYL) 20 MG tablet Take 1 tablet (20 mg total) by mouth 3 (three) times daily as needed for spasms. 07/25/21  Yes Particia Nearing, PA-C  albuterol [facility]) [phone] Base) MCG/ACT inhaler Inhale 4 puffs into the lungs every 6 (six) hours as needed for wheezing or shortness of breath. 03/03/21   Vella Kohler, MD  cetirizine (ZYRTEC) 10 MG tablet Take 1 tablet (10 mg total) by mouth 2 (two) times daily. TAKE ONE TABLET  (10 MG TOTAL) BY MOUTH DAILY. 03/03/21   Vella Kohler, MD  doxycycline (VIBRA-TABS) 100 MG tablet Take 100 mg by mouth 2 (two) times daily. Patient not taking: Reported on 07/02/2021 03/10/21   [provider]  fluticasone (FLONASE) 50 MCG/ACT nasal spray Place 2 sprays into both nostrils daily. 03/03/21   Vella Kohler, MD  fluticasone (FLOVENT HFA) 110 MCG/ACT inhaler INHALE ONE PUFF INTO THE LUNGS TWO TIMES DAILY FOR 2 WEEKS Patient not taking: Reported on 07/02/2021 03/03/21   Vella Kohler, MD  Melatonin 3 MG TABS Take 1 tablet by mouth at bedtime. Patient not taking: Reported on 07/02/2021    [provider]  montelukast (SINGULAIR) 10 MG tablet TAKE ONE TABLET (10 MG TOTAL) BY MOUTH AT BEDTIME. 03/03/21   Vella Kohler, MD  Olopatadine HCl (PATADAY) 0.2 % SOLN Apply 1 drop to eye daily as needed (FOR ALLERGIES). 03/03/21   Vella Kohler, MD  triamcinolone (KENALOG) 0.025 % ointment APPLY TWO TIMES DAILY Patient not taking: Reported on 07/02/2021 04/21/21   Vella Kohler, MD    Family History Family History  Problem Relation Age of Onset   Asthma Father    Asthma Maternal Grandmother    Allergic rhinitis Neg Hx    Angioedema Neg Hx    Eczema Neg Hx    Immunodeficiency Neg Hx    Urticaria Neg Hx    Atopy Neg Hx     Social  History Social History   Tobacco Use   Smoking status: Never   Smokeless tobacco: Never  Vaping Use   Vaping Use: Never used  Substance Use Topics   Alcohol use: No   Drug use: Yes    Types: Marijuana     Allergies   Patient has no known allergies.   Review of Systems Review of Systems Per HPI  Physical Exam Triage Vital Signs ED Triage Vitals  Enc Vitals Group     BP 07/25/21 1523 110/72     Pulse Rate 07/25/21 1523 74     Resp 07/25/21 1523 20     Temp 07/25/21 1523 97.9 F (36.6 C)     Temp src --      SpO2 07/25/21 1523 97 %     Weight 07/25/21 1522 150 lb (68 kg)     Height --      Head  Circumference --      Peak Flow --      Pain Score 07/25/21 1520 0     Pain Loc --      Pain Edu? --      Excl. in GC? --    No data found.  Updated Vital Signs BP 110/72   Pulse 74   Temp 97.9 F (36.6 C)   Resp 20   Wt 150 lb (68 kg)   SpO2 97%   BMI 22.15 kg/m   Visual Acuity Right Eye Distance:   Left Eye Distance:   Bilateral Distance:    Right Eye Near:   Left Eye Near:    Bilateral Near:     Physical Exam Vitals and nursing note reviewed.  Constitutional:      Appearance: Normal appearance.  HENT:     Head: Atraumatic.     Mouth/Throat:     Mouth: Mucous membranes are moist.  Eyes:     Extraocular Movements: Extraocular movements intact.     Conjunctiva/sclera: Conjunctivae normal.  Cardiovascular:     Rate and Rhythm: Normal rate and regular rhythm.  Pulmonary:     Effort: Pulmonary effort is normal.     Breath sounds: Normal breath sounds.  Abdominal:     General: Bowel sounds are normal. There is no distension.     Palpations: Abdomen is soft.     Tenderness: There is no abdominal tenderness. There is no right CVA tenderness, left CVA tenderness or guarding.  Musculoskeletal:        General: Normal range of motion.     Cervical back: Normal range of motion and neck supple.  Skin:    General: Skin is warm and dry.  Neurological:     General: No focal deficit present.     Mental Status: He is oriented to person, place, and time.  Psychiatric:        Mood and Affect: Mood normal.        Thought Content: Thought content normal.        Judgment: Judgment normal.     UC Treatments / Results  Labs (all labs ordered are listed, but only abnormal results are displayed) Labs Reviewed  OVA + PARASITE EXAM    EKG   Radiology No results found.  Procedures Procedures (including critical care time)  Medications Ordered in UC Medications - No data to display  Initial Impression / Assessment and Plan / UC Course  I have reviewed the triage  vital signs and the nursing notes.  Pertinent labs & imaging results that  were available during my care of the patient were reviewed by me and considered in my medical decision making (see chart for details).     Vital signs and exam completely benign and reassuring, symptoms appear most consistent with an IBS type picture but will perform a stool study for ova and parasites for reassurance purposes per his request.  We will trial Bentyl, probiotics, fiber supplement and possibly consider an elimination diet to rule other factors out.  GI follow-up for unresolving symptoms.  Final Clinical Impressions(s) / UC Diagnoses   Final diagnoses:  Diarrhea, unspecified type  Abdominal cramping     Discharge Instructions      May try a probiotic supplement and fiber supplement daily    ED Prescriptions     Medication Sig Dispense Auth. Provider   dicyclomine (BENTYL) 20 MG tablet Take 1 tablet (20 mg total) by mouth 3 (three) times daily as needed for spasms. 60 tablet Particia Nearing, [place]      PDMP not reviewed this encounter.   Particia Nearing, [place] 07/25/21 1726

## 2021-07-25 NOTE — Discharge Instructions (Signed)
May try a probiotic supplement and fiber supplement daily

## 2021-07-25 NOTE — ED Triage Notes (Signed)
Pt state he has had a few episodes of diarrhea over past few weeks and wants to be checked for tape worms

## 2021-07-27 ENCOUNTER — Other Ambulatory Visit: Payer: Self-pay | Admitting: Pediatrics

## 2021-07-27 DIAGNOSIS — J4540 Moderate persistent asthma, uncomplicated: Secondary | ICD-10-CM

## 2021-10-01 ENCOUNTER — Other Ambulatory Visit: Payer: Self-pay | Admitting: Allergy & Immunology

## 2021-10-01 DIAGNOSIS — J4540 Moderate persistent asthma, uncomplicated: Secondary | ICD-10-CM

## 2021-11-24 ENCOUNTER — Ambulatory Visit (INDEPENDENT_AMBULATORY_CARE_PROVIDER_SITE_OTHER): Payer: Medicaid Other | Admitting: Internal Medicine

## 2021-11-24 ENCOUNTER — Encounter: Payer: Self-pay | Admitting: Internal Medicine

## 2021-11-24 VITALS — BP 124/64 | HR 68 | Ht 69.0 in | Wt 156.4 lb

## 2021-11-24 DIAGNOSIS — J4540 Moderate persistent asthma, uncomplicated: Secondary | ICD-10-CM | POA: Diagnosis not present

## 2021-11-24 DIAGNOSIS — J3089 Other allergic rhinitis: Secondary | ICD-10-CM

## 2021-11-24 DIAGNOSIS — Z0000 Encounter for general adult medical examination without abnormal findings: Secondary | ICD-10-CM

## 2021-11-24 DIAGNOSIS — J309 Allergic rhinitis, unspecified: Secondary | ICD-10-CM | POA: Insufficient documentation

## 2021-11-24 DIAGNOSIS — E559 Vitamin D deficiency, unspecified: Secondary | ICD-10-CM | POA: Diagnosis not present

## 2021-11-24 DIAGNOSIS — Z1159 Encounter for screening for other viral diseases: Secondary | ICD-10-CM

## 2021-11-24 DIAGNOSIS — K219 Gastro-esophageal reflux disease without esophagitis: Secondary | ICD-10-CM | POA: Diagnosis not present

## 2021-11-24 MED ORDER — FLUTICASONE PROPIONATE HFA 110 MCG/ACT IN AERO: 1.0000 | INHALATION_SPRAY | Freq: Every day | RESPIRATORY_TRACT | 2 refills | Status: AC

## 2021-11-24 MED ORDER — CETIRIZINE HCL 10 MG PO TABS: 10.0000 mg | ORAL_TABLET | Freq: Every day | ORAL | 11 refills | Status: AC

## 2021-11-24 MED ORDER — MONTELUKAST SODIUM 10 MG PO TABS: 10.0000 mg | ORAL_TABLET | Freq: Every day | ORAL | 11 refills | Status: AC

## 2021-11-24 MED ORDER — ALBUTEROL SULFATE HFA 108 (90 BASE) MCG/ACT IN AERS: 2.0000 | INHALATION_SPRAY | RESPIRATORY_TRACT | 5 refills | Status: AC | PRN

## 2021-11-24 MED ORDER — FLUTICASONE PROPIONATE 50 MCG/ACT NA SUSP: 2.0000 | Freq: Every day | NASAL | 11 refills | Status: AC

## 2021-11-24 NOTE — Patient Instructions (Signed)
Please continue taking medications as prescribed.   Please get fasting blood tests done before the next visit. 

## 2021-11-24 NOTE — Assessment & Plan Note (Signed)
Well controlled with Zyrtec and Singulair 

## 2021-11-24 NOTE — Progress Notes (Signed)
? ?New Patient Office Visit ? ?Subjective:  ?Patient ID: Marvin Marks, male    DOB: [birth year]  Age: 21 y.o. MRN: [id number] ? ?CC:  ?Chief Complaint  ?Patient presents with  ? New Patient (Initial Visit)  ?  Establish care wants blood work  ? ? ?HPI ?Marvin Marks is a 21 y.o. male with past medical history of asthma and allergic rhinitis who presents for establishing care. ? ?He has history of allergic rhinitis, for which he takes Zyrtec and Singulair.  He currently denies any nasal congestion, postnasal drip, sore throat, fever or chills.  He also uses Flovent for asthma.  He has run out of his albuterol inhaler.  He currently denies any dyspnea or wheezing. ? ?Chart review suggests history of marijuana use, but he states that he has quit marijuana use and smoking.  He currently denies any anhedonia, SI or HI. ? ? ?Past Medical History:  ?Diagnosis Date  ? Allergic rhinoconjunctivitis   ? Asthma   ? Gastroesophageal reflux   ? ? ?Past Surgical History:  ?Procedure Laterality Date  ? ADENOIDECTOMY    ? MYRINGOTOMY WITH TUBE PLACEMENT    ? TONSILLECTOMY    ? ? ?Family History  ?Problem Relation Age of Onset  ? Asthma Father   ? Asthma Maternal Grandmother   ? Allergic rhinitis Neg Hx   ? Angioedema Neg Hx   ? Eczema Neg Hx   ? Immunodeficiency Neg Hx   ? Urticaria Neg Hx   ? Atopy Neg Hx   ? ? ?Social History  ? ?Socioeconomic History  ? Marital status: Single  ?  Spouse name: Not on file  ? Number of children: Not on file  ? Years of education: Not on file  ? Highest education level: Not on file  ?Occupational History  ? Not on file  ?Tobacco Use  ? Smoking status: Never  ? Smokeless tobacco: Never  ?Vaping Use  ? Vaping Use: Never used  ?Substance and Sexual Activity  ? Alcohol use: No  ? Drug use: Yes  ?  Types: Marijuana  ? Sexual activity: Yes  ?  Birth control/protection: Condom  ?  Comment: Heterosexual  ?Other Topics Concern  ? Not on file  ?Social History Narrative  ? Not on file  ? ?Social  Determinants of Health  ? ?Financial Resource Strain: Not on file  ?Food Insecurity: Not on file  ?Transportation Needs: Not on file  ?Physical Activity: Not on file  ?Stress: Not on file  ?Social Connections: Not on file  ?Intimate Partner Violence: Not on file  ? ? ?ROS ?Review of Systems  ?Constitutional:  Negative for chills and fever.  ?HENT:  Negative for congestion and sore throat.   ?Eyes:  Negative for pain and discharge.  ?Respiratory:  Negative for cough and shortness of breath.   ?Cardiovascular:  Negative for chest pain and palpitations.  ?Gastrointestinal:  Negative for constipation, diarrhea, nausea and vomiting.  ?Endocrine: Negative for polydipsia and polyuria.  ?Genitourinary:  Negative for dysuria and hematuria.  ?Musculoskeletal:  Negative for neck pain and neck stiffness.  ?Skin:  Negative for rash.  ?Neurological:  Negative for dizziness, weakness, numbness and headaches.  ?Psychiatric/Behavioral:  Negative for agitation and behavioral problems.   ? ?Objective:  ? ?Today's Vitals: BP 124/64   Pulse 68   Ht 5' 9"  (1.753 m)   Wt 156 lb 6.4 oz (70.9 kg)   SpO2 96%   BMI 23.10 kg/m?  ? ?Physical Exam ?  Vitals reviewed.  ?Constitutional:   ?   General: He is not in acute distress. ?   Appearance: He is not diaphoretic.  ?HENT:  ?   Head: Normocephalic and atraumatic.  ?   Nose: Nose normal.  ?   Mouth/Throat:  ?   Mouth: Mucous membranes are moist.  ?Eyes:  ?   General: No scleral icterus. ?   Extraocular Movements: Extraocular movements intact.  ?Cardiovascular:  ?   Rate and Rhythm: Normal rate and regular rhythm.  ?   Pulses: Normal pulses.  ?   Heart sounds: Normal heart sounds. No murmur heard. ?Pulmonary:  ?   Breath sounds: Normal breath sounds. No wheezing or rales.  ?Musculoskeletal:  ?   Cervical back: Neck supple. No tenderness.  ?   Right lower leg: No edema.  ?   Left lower leg: No edema.  ?Skin: ?   General: Skin is warm.  ?   Findings: No rash.  ?Neurological:  ?   General: No  focal deficit present.  ?   Mental Status: He is alert and oriented to person, place, and time.  ?Psychiatric:     ?   Mood and Affect: Mood normal.     ?   Behavior: Behavior normal.  ? ? ?Assessment & Plan:  ? ?Problem List Items Addressed This Visit   ? ?  ? Respiratory  ? Allergic rhinitis - Primary  ?  Well-controlled with Zyrtec and Singulair ?  ?  ? Relevant Medications  ? cetirizine (ZYRTEC) 10 MG tablet  ? fluticasone (FLONASE) 50 MCG/ACT nasal spray  ? montelukast (SINGULAIR) 10 MG tablet  ? Other Relevant Orders  ? CBC with Differential/Platelet  ? Moderate persistent asthma without complication  ?  Well controlled with Flovent and as needed albuterol, refills provided ?Takes Zyrtec and Singulair for allergies ?  ?  ? Relevant Medications  ? fluticasone (FLOVENT HFA) 110 MCG/ACT inhaler  ? montelukast (SINGULAIR) 10 MG tablet  ? albuterol (VENTOLIN HFA) 108 (90 Base) MCG/ACT inhaler  ? Other Relevant Orders  ? CBC with Differential/Platelet  ? ?Other Visit Diagnoses   ? ? Need for hepatitis C screening test      ? Relevant Orders  ? Hepatitis C Antibody  ? Annual physical exam      ? Relevant Orders  ? TSH  ? CMP14+EGFR  ? CBC with Differential/Platelet  ? Lipid Profile  ? Vitamin D deficiency      ? Relevant Orders  ? VITAMIN D 25 Hydroxy (Vit-D Deficiency, Fractures)  ? ?  ? ? ?Outpatient Encounter Medications as of 11/24/2021  ?Medication Sig  ? Olopatadine HCl (PATADAY) 0.2 % SOLN Apply 1 drop to eye daily as needed (FOR ALLERGIES).  ? [DISCONTINUED] albuterol (VENTOLIN HFA) 108 (90 Base) MCG/ACT inhaler Inhale 2 puffs into the lungs every 4 (four) hours as needed for wheezing or shortness of breath.  ? [DISCONTINUED] cetirizine (ZYRTEC) 10 MG tablet Take 1 tablet (10 mg total) by mouth 2 (two) times daily. TAKE ONE TABLET (10 MG TOTAL) BY MOUTH DAILY.  ? [DISCONTINUED] fluticasone (FLONASE) 50 MCG/ACT nasal spray Place 2 sprays into both nostrils daily.  ? [DISCONTINUED] fluticasone (FLOVENT HFA) 110  MCG/ACT inhaler INHALE ONE PUFF INTO THE LUNGS TWO TIMESDAILY  ? [DISCONTINUED] montelukast (SINGULAIR) 10 MG tablet TAKE ONE TABLET (10 MG TOTAL) BY MOUTH AT BEDTIME.  ? albuterol (VENTOLIN HFA) 108 (90 Base) MCG/ACT inhaler Inhale 2 puffs into the lungs every 4 (four)  hours as needed for wheezing or shortness of breath.  ? cetirizine (ZYRTEC) 10 MG tablet Take 1 tablet (10 mg total) by mouth daily.  ? fluticasone (FLONASE) 50 MCG/ACT nasal spray Place 2 sprays into both nostrils daily.  ? fluticasone (FLOVENT HFA) 110 MCG/ACT inhaler Inhale 1 puff into the lungs daily.  ? Melatonin 3 MG TABS Take 1 tablet by mouth at bedtime. (Patient not taking: Reported on 07/02/2021)  ? montelukast (SINGULAIR) 10 MG tablet Take 1 tablet (10 mg total) by mouth at bedtime.  ? [DISCONTINUED] dicyclomine (BENTYL) 20 MG tablet Take 1 tablet (20 mg total) by mouth 3 (three) times daily as needed for spasms. (Patient not taking: Reported on 11/24/2021)  ? [DISCONTINUED] doxycycline (VIBRA-TABS) 100 MG tablet Take 100 mg by mouth 2 (two) times daily. (Patient not taking: Reported on 07/02/2021)  ? [DISCONTINUED] triamcinolone (KENALOG) 0.025 % ointment APPLY TWO TIMES DAILY (Patient not taking: Reported on 07/02/2021)  ? ?No facility-administered encounter medications on file as of 11/24/2021.  ? ? ?Follow-up: Return in about 6 months (around 05/26/2022) for Annual physical.  ? ?Lindell Spar, MD ?

## 2021-11-24 NOTE — Assessment & Plan Note (Signed)
Now better with diet modification ?

## 2021-11-24 NOTE — Assessment & Plan Note (Signed)
Well controlled with Flovent and as needed albuterol, refills provided ?Takes Zyrtec and Singulair for allergies ?

## 2021-12-23 ENCOUNTER — Ambulatory Visit: Payer: Self-pay

## 2022-01-27 ENCOUNTER — Emergency Department (HOSPITAL_COMMUNITY)
Admission: EM | Admit: 2022-01-27 | Discharge: 2022-01-27 | Disposition: A | Discharge status: Home / Self Care | Payer: Medicaid Other | Attending: Emergency Medicine | Admitting: Emergency Medicine

## 2022-01-27 ENCOUNTER — Encounter (HOSPITAL_COMMUNITY): Payer: Self-pay | Admitting: Emergency Medicine

## 2022-01-27 ENCOUNTER — Other Ambulatory Visit: Payer: Self-pay

## 2022-01-27 DIAGNOSIS — Z23 Encounter for immunization: Secondary | ICD-10-CM | POA: Diagnosis not present

## 2022-01-27 DIAGNOSIS — W312XXA Contact with powered woodworking and forming machines, initial encounter: Secondary | ICD-10-CM | POA: Diagnosis not present

## 2022-01-27 DIAGNOSIS — S61011A Laceration without foreign body of right thumb without damage to nail, initial encounter: Secondary | ICD-10-CM | POA: Diagnosis not present

## 2022-01-27 DIAGNOSIS — S65401A Unspecified injury of blood vessel of right thumb, initial encounter: Secondary | ICD-10-CM | POA: Diagnosis present

## 2022-01-27 MED ORDER — TETANUS-DIPHTH-ACELL PERTUSSIS 5-2.5-18.5 LF-MCG/0.5 IM SUSY
0.5000 mL | PREFILLED_SYRINGE | Freq: Once | INTRAMUSCULAR | Status: AC
  Administered 2022-01-27: 0.5 mL via INTRAMUSCULAR
  Filled 2022-01-27: qty 0.5

## 2022-01-27 MED ORDER — LIDOCAINE HCL (PF) 1 % IJ SOLN
5.0000 mL | Freq: Once | INTRAMUSCULAR | Status: AC
  Administered 2022-01-27: 5 mL via INTRADERMAL
  Filled 2022-01-27: qty 5

## 2022-01-27 MED ORDER — POVIDONE-IODINE 10 % EX SOLN
CUTANEOUS | Status: DC | PRN
  Filled 2022-01-27: qty 14.8

## 2022-01-27 NOTE — ED Notes (Signed)
Pt given sterile water/peroxide to soak hand in.

## 2022-01-27 NOTE — ED Provider Notes (Incomplete)
[facility] EMERGENCY DEPARTMENT Provider Note   CSN: [id number] Arrival date & time: 01/27/22  1724     History {Add pertinent medical, surgical, social history, OB history to HPI:1} Chief Complaint  Patient presents with   Laceration    Marvin Marks is a 21 y.o. male.   Laceration      Home Medications Prior to Admission medications   Medication Sig Start Date End Date Taking? Authorizing Provider  albuterol (VENTOLIN HFA) 108 (90 Base) MCG/ACT inhaler Inhale 2 puffs into the lungs every 4 (four) hours as needed for wheezing or shortness of breath. 11/24/21   Anabel Halon, MD  cetirizine (ZYRTEC) 10 MG tablet Take 1 tablet (10 mg total) by mouth daily. 11/24/21   Anabel Halon, MD  fluticasone (FLONASE) 50 MCG/ACT nasal spray Place 2 sprays into both nostrils daily. 11/24/21   Anabel Halon, MD  fluticasone (FLOVENT HFA) 110 MCG/ACT inhaler Inhale 1 puff into the lungs daily. 11/24/21   Anabel Halon, MD  Melatonin 3 MG TABS Take 1 tablet by mouth at bedtime. Patient not taking: Reported on 07/02/2021    [provider]  montelukast (SINGULAIR) 10 MG tablet Take 1 tablet (10 mg total) by mouth at bedtime. 11/24/21   Anabel Halon, MD  Olopatadine HCl (PATADAY) 0.2 % SOLN Apply 1 drop to eye daily as needed (FOR ALLERGIES). 03/03/21   Vella Kohler, MD      Allergies    Patient has no known allergies.    Review of Systems   Review of Systems  Physical Exam Updated Vital Signs BP 127/69 (BP Location: Right Arm)   Pulse (!) 56   Temp 98.5 F (36.9 C) (Oral)   Resp 18   Ht 5\' 9"  (1.753 m)   Wt 74.8 kg   SpO2 97%   BMI 24.37 kg/m  Physical Exam  ED Results / Procedures / Treatments   Labs (all labs ordered are listed, but only abnormal results are displayed) Labs Reviewed - No data to display  EKG None  Radiology No results found.  Procedures Procedures  {Document cardiac monitor, telemetry assessment procedure when  appropriate:1}   LACERATION REPAIR Performed by: Aayliah Rotenberry Authorized by: Yoshika Vensel Consent: Verbal consent obtained. Risks and benefits: risks, benefits and alternatives were discussed Consent given by: patient Patient identity confirmed: provided demographic data Prepped and Draped in normal sterile fashion Wound explored  Laceration Location: right thumb  Laceration Length: 3 cm  No Foreign Bodies seen or palpated  Anesthesia: local infiltration  Local anesthetic: lidocaine 1% w/o epinephrine  Anesthetic total: 3 ml  Irrigation method: syringe Amount of cleaning: standard  Skin closure: 4-0 ethilon  Number of sutures: 4  Technique: simple interrupted  Patient tolerance: Patient tolerated the procedure well with no immediate complications.  Medications Ordered in ED Medications  povidone-iodine (BETADINE) 10 % external solution ( Topical Given 01/27/22 2129)  lidocaine (PF) (XYLOCAINE) 1 % injection 5 mL (5 mLs Intradermal Given 01/27/22 2129)  Tdap (BOOSTRIX) injection 0.5 mL (0.5 mLs Intramuscular Given 01/27/22 2127)    ED Course/ Medical Decision Making/ A&P                           Medical Decision Making Risk OTC drugs. Prescription drug management.   Td updated  {Document critical care time when appropriate:1} {Document review of labs and clinical decision tools ie heart score, Chads2Vasc2 etc:1}  {Document your  independent review of radiology images, and any outside records:1} {Document your discussion with family members, caretakers, and with consultants:1} {Document social determinants of health affecting pt's care:1} {Document your decision making why or why not admission, treatments were needed:1} Final Clinical Impression(s) / ED Diagnoses Final diagnoses:  Laceration of right thumb without foreign body without damage to nail, initial encounter    Rx / DC Orders ED Discharge Orders     None

## 2022-01-27 NOTE — Discharge Instructions (Signed)
Keep the wound clean with mild soap and water.  Keep it bandaged until it heals.  Sutures out in 8 to 10 days.  Return to the emergency department for any worsening symptoms or signs of infection.

## 2022-01-27 NOTE — ED Triage Notes (Signed)
Pt to ER with c/o laceration to left proximal thumb from a saw.  Bleeding controlled.

## 2022-02-09 ENCOUNTER — Emergency Department (HOSPITAL_COMMUNITY)
Admission: EM | Admit: 2022-02-09 | Discharge: 2022-02-09 | Disposition: A | Discharge status: Home / Self Care | Payer: Medicaid Other | Attending: Emergency Medicine | Admitting: Emergency Medicine

## 2022-02-09 ENCOUNTER — Encounter (HOSPITAL_COMMUNITY): Payer: Self-pay

## 2022-02-09 ENCOUNTER — Other Ambulatory Visit: Payer: Self-pay

## 2022-02-09 DIAGNOSIS — Z4802 Encounter for removal of sutures: Secondary | ICD-10-CM | POA: Insufficient documentation

## 2022-02-09 DIAGNOSIS — S61012D Laceration without foreign body of left thumb without damage to nail, subsequent encounter: Secondary | ICD-10-CM | POA: Diagnosis not present

## 2022-02-09 NOTE — ED Provider Notes (Signed)
[facility] EMERGENCY DEPARTMENT Provider Note   CSN: [id number] Arrival date & time: 02/09/22  1033     History  Chief Complaint  Patient presents with   Suture / Staple Removal    Marvin Marks is a 21 y.o. male presenting for suture removal.  He was seen here 13 days ago for a laceration on his left thumb, having had 4 stitches placed.  He has no complaints of pain, swelling, drainage from the laceration site.  The history is provided by the patient.       Home Medications Prior to Admission medications   Medication Sig Start Date End Date Taking? Authorizing Provider  albuterol (VENTOLIN HFA) 108 (90 Base) MCG/ACT inhaler Inhale 2 puffs into the lungs every 4 (four) hours as needed for wheezing or shortness of breath. 11/24/21   Anabel Halon, MD  cetirizine (ZYRTEC) 10 MG tablet Take 1 tablet (10 mg total) by mouth daily. 11/24/21   Anabel Halon, MD  fluticasone (FLONASE) 50 MCG/ACT nasal spray Place 2 sprays into both nostrils daily. 11/24/21   Anabel Halon, MD  fluticasone (FLOVENT HFA) 110 MCG/ACT inhaler Inhale 1 puff into the lungs daily. 11/24/21   Anabel Halon, MD  Melatonin 3 MG TABS Take 1 tablet by mouth at bedtime. Patient not taking: Reported on 07/02/2021    [provider]  montelukast (SINGULAIR) 10 MG tablet Take 1 tablet (10 mg total) by mouth at bedtime. 11/24/21   Anabel Halon, MD  Olopatadine HCl (PATADAY) 0.2 % SOLN Apply 1 drop to eye daily as needed (FOR ALLERGIES). 03/03/21   Vella Kohler, MD      Allergies    Patient has no known allergies.    Review of Systems   Review of Systems  Constitutional:  Negative for chills and fever.  Respiratory:  Negative for shortness of breath and wheezing.   Skin:  Positive for wound.  Neurological:  Negative for numbness.    Physical Exam Updated Vital Signs BP (!) 124/52 (BP Location: Left Arm)   Pulse (!) 55   Temp 97.8 F (36.6 C) (Oral)   Resp 16   Ht 5\' 9"  (1.753 m)   Wt  74.8 kg   SpO2 99%   BMI 24.37 kg/m  Physical Exam Constitutional:      Appearance: He is well-developed.  HENT:     Head: Normocephalic.  Cardiovascular:     Rate and Rhythm: Normal rate.  Pulmonary:     Effort: Pulmonary effort is normal.  Skin:    Findings: Laceration present.     Comments: Well-healed laceration left dorsal thumb.  Neurological:     Mental Status: He is alert and oriented to person, place, and time.     Sensory: No sensory deficit.     ED Results / Procedures / Treatments   Labs (all labs ordered are listed, but only abnormal results are displayed) Labs Reviewed - No data to display  EKG None  Radiology No results found.  Procedures Procedures    SUTURE REMOVAL Performed by:  Consent: Verbal consent obtained. Patient identity confirmed: provided demographic data Time out: Immediately prior to procedure a "time out" was called to verify the correct patient, procedure, equipment, support staff and site/side marked as required.  Location details: Left thumb  Wound Appearance: clean  Sutures/Staples Removed: 4  Facility: sutures placed in this facility Patient tolerance: Patient tolerated the procedure well with no immediate complications.  Medications Ordered in ED Medications - No data to display  ED Course/ Medical Decision Making/ A&P                           Medical Decision Making Simple suture removal, patient tolerated well, no signs of any infection or dehisced wound edges.  We discussed continued home wound care, parent follow-up anticipated.           Final Clinical Impression(s) / ED Diagnoses Final diagnoses:  Visit for suture removal    Rx / DC Orders ED Discharge Orders     None         Victoriano Lain 02/09/22 1135    Bethann Berkshire, MD 02/10/22 1752

## 2022-02-09 NOTE — ED Triage Notes (Signed)
Pt here for suture removal to wound on his thumb. Pt denies any signs or symptoms of infection. Sutures were placed on 6/16.

## 2022-04-14 DIAGNOSIS — Z0000 Encounter for general adult medical examination without abnormal findings: Secondary | ICD-10-CM | POA: Diagnosis not present

## 2022-04-18 ENCOUNTER — Telehealth: Payer: Self-pay | Admitting: Internal Medicine

## 2022-04-18 NOTE — Telephone Encounter (Signed)
Patient is looking for past sickle cell results if any. Also wants them sent via mychart.

## 2022-04-18 NOTE — Telephone Encounter (Signed)
I do not see where patient had labs drawn can you verify when and where they were drawn

## 2022-04-18 NOTE — Telephone Encounter (Signed)
Patient advised with verbal understanding  

## 2022-04-18 NOTE — Telephone Encounter (Signed)
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

## 2022-04-24 ENCOUNTER — Ambulatory Visit: Payer: Medicaid Other | Admitting: Internal Medicine

## 2022-05-30 ENCOUNTER — Encounter: Payer: Medicaid Other | Admitting: Internal Medicine

## 2022-06-18 DIAGNOSIS — R69 Illness, unspecified: Secondary | ICD-10-CM | POA: Diagnosis not present

## 2022-08-05 DIAGNOSIS — R69 Illness, unspecified: Secondary | ICD-10-CM | POA: Diagnosis not present

## 2022-08-12 DIAGNOSIS — R69 Illness, unspecified: Secondary | ICD-10-CM | POA: Diagnosis not present

## 2022-08-30 ENCOUNTER — Ambulatory Visit: Payer: Medicaid Other | Admitting: Internal Medicine

## 2022-09-11 ENCOUNTER — Ambulatory Visit: Payer: Medicaid Other | Admitting: Internal Medicine

## 2022-10-12 ENCOUNTER — Encounter: Payer: Self-pay | Admitting: Radiology

## 2022-10-26 ENCOUNTER — Encounter: Payer: Self-pay | Admitting: Internal Medicine

## 2022-10-26 ENCOUNTER — Ambulatory Visit (INDEPENDENT_AMBULATORY_CARE_PROVIDER_SITE_OTHER): Payer: Commercial Managed Care - HMO | Admitting: Internal Medicine

## 2022-10-26 VITALS — BP 127/78 | HR 63 | Ht 69.0 in | Wt 147.6 lb

## 2022-10-26 DIAGNOSIS — K219 Gastro-esophageal reflux disease without esophagitis: Secondary | ICD-10-CM

## 2022-10-26 DIAGNOSIS — Z113 Encounter for screening for infections with a predominantly sexual mode of transmission: Secondary | ICD-10-CM | POA: Diagnosis not present

## 2022-10-26 MED ORDER — OMEPRAZOLE 20 MG PO CPDR: 20.0000 mg | DELAYED_RELEASE_CAPSULE | Freq: Every day | ORAL | 3 refills | Status: AC | PRN

## 2022-10-26 NOTE — Assessment & Plan Note (Signed)
Was better with diet modification Has recurrent epigastric pain Advised to take omeprazole as needed for heartburn Avoid hot and spicy food Avoid marijuana use

## 2022-10-26 NOTE — Patient Instructions (Signed)
Please take Omeprazole as needed for acid reflux.  Please avoid hot and spicy food. Try to stay away from marijuana products.

## 2022-10-26 NOTE — Assessment & Plan Note (Signed)
Currently asymptomatic except mild fatigue Check HIV, hep C antibody, HBsAg and RPR Check urine chlamydia and gonorrhea Check UA Advised to use barrier contraceptive to prevent STD

## 2022-10-26 NOTE — Progress Notes (Signed)
Acute Office Visit  Subjective:    Patient ID: Marvin Marks, male    DOB: 24-Dec-2000, 22 y.o.   MRN: [id number]  Chief Complaint  Patient presents with   Gastroesophageal Reflux    Patient is having acid reflux. He also wants a to be checked for STD    HPI Patient is in today for complaint of recurrent heartburn.  He was having improvement in his symptoms with diet modification.  He has started having recurrent epigastric pain and nausea lately.  He is trying to avoid marijuana use.  Denies any vomiting, melena or hematochezia.  He requests STD screening today.  He is sexually active with a male partner currently.  He uses condoms on intermittent basis.  Denies any dysuria, hematuria or urethral discharge currently.  He reports mild fatigue, but denies any fever or chills.  Denies any genital area rash.  Past Medical History:  Diagnosis Date   Allergic rhinoconjunctivitis    Asthma    Gastroesophageal reflux     Past Surgical History:  Procedure Laterality Date   ADENOIDECTOMY     MYRINGOTOMY WITH TUBE PLACEMENT     TONSILLECTOMY      Family History  Problem Relation Age of Onset   Asthma Father    Asthma Maternal Grandmother    Allergic rhinitis Neg Hx    Angioedema Neg Hx    Eczema Neg Hx    Immunodeficiency Neg Hx    Urticaria Neg Hx    Atopy Neg Hx     Social History   Socioeconomic History   Marital status: Single    Spouse name: Not on file   Number of children: Not on file   Years of education: Not on file   Highest education level: Not on file  Occupational History   Not on file  Tobacco Use   Smoking status: Never   Smokeless tobacco: Never  Vaping Use   Vaping Use: Never used  Substance and Sexual Activity   Alcohol use: No   Drug use: Yes    Types: Marijuana   Sexual activity: Yes    Birth control/protection: Condom    Comment: Heterosexual  Other Topics Concern   Not on file  Social History Narrative   Not on file   Social  Determinants of Health   Financial Resource Strain: Not on file  Food Insecurity: Not on file  Transportation Needs: Not on file  Physical Activity: Not on file  Stress: Not on file  Social Connections: Not on file  Intimate Partner Violence: Not on file    Outpatient Medications Prior to Visit  Medication Sig Dispense Refill   albuterol (VENTOLIN HFA) 108 (90 Base) MCG/ACT inhaler Inhale 2 puffs into the lungs every 4 (four) hours as needed for wheezing or shortness of breath. 18 g 5   cetirizine (ZYRTEC) 10 MG tablet Take 1 tablet (10 mg total) by mouth daily. 30 tablet 11   fluticasone (FLONASE) 50 MCG/ACT nasal spray Place 2 sprays into both nostrils daily. 16 g 11   fluticasone (FLOVENT HFA) 110 MCG/ACT inhaler Inhale 1 puff into the lungs daily. 12 g 2   Melatonin 3 MG TABS Take 1 tablet by mouth at bedtime. (Patient not taking: Reported on 07/02/2021)     montelukast (SINGULAIR) 10 MG tablet Take 1 tablet (10 mg total) by mouth at bedtime. 30 tablet 11   Olopatadine HCl (PATADAY) 0.2 % SOLN Apply 1 drop to eye daily as needed (FOR ALLERGIES).  2.5 mL 11   No facility-administered medications prior to visit.    No Known Allergies  Review of Systems  Constitutional:  Negative for chills and fever.  HENT:  Negative for congestion and sore throat.   Eyes:  Negative for pain and discharge.  Respiratory:  Negative for cough and shortness of breath.   Cardiovascular:  Negative for chest pain and palpitations.  Gastrointestinal:  Positive for abdominal pain and nausea. Negative for diarrhea and vomiting.  Endocrine: Negative for polydipsia and polyuria.  Genitourinary:  Negative for dysuria and hematuria.  Musculoskeletal:  Negative for neck pain and neck stiffness.  Skin:  Negative for rash.  Neurological:  Negative for dizziness, weakness, numbness and headaches.  Psychiatric/Behavioral:  Negative for agitation and behavioral problems.        Objective:    Physical  Exam Vitals reviewed.  Constitutional:      General: He is not in acute distress.    Appearance: He is not diaphoretic.  HENT:     Head: Normocephalic and atraumatic.     Nose: Nose normal.     Mouth/Throat:     Mouth: Mucous membranes are moist.  Eyes:     General: No scleral icterus.    Extraocular Movements: Extraocular movements intact.  Cardiovascular:     Rate and Rhythm: Normal rate and regular rhythm.     Pulses: Normal pulses.     Heart sounds: Normal heart sounds. No murmur heard. Pulmonary:     Breath sounds: Normal breath sounds. No wheezing or rales.  Musculoskeletal:     Cervical back: Neck supple. No tenderness.     Right lower leg: No edema.     Left lower leg: No edema.  Skin:    General: Skin is warm.     Findings: No rash.  Neurological:     General: No focal deficit present.     Mental Status: He is alert and oriented to person, place, and time.     BP 127/78 (BP Location: Right Arm, Patient Position: Sitting, Cuff Size: Normal)   Pulse 63   Ht '5\' 9"'$  (1.753 m)   Wt 147 lb 9.6 oz (67 kg)   SpO2 98%   BMI 21.80 kg/m  Wt Readings from Last 3 Encounters:  10/26/22 147 lb 9.6 oz (67 kg)  02/09/22 165 lb (74.8 kg) (65 %, Z= 0.40)*  01/27/22 165 lb (74.8 kg) (66 %, Z= 0.40)*   * Growth percentiles are based on CDC (Boys, 2-20 Years) data.        Assessment & Plan:   Problem List Items Addressed This Visit       Digestive   Gastroesophageal reflux - Primary    Was better with diet modification Has recurrent epigastric pain Advised to take omeprazole as needed for heartburn Avoid hot and spicy food Avoid marijuana use      Relevant Medications   omeprazole (PRILOSEC) 20 MG capsule     Other   Screen for STD (sexually transmitted disease)    Currently asymptomatic except mild fatigue Check HIV, hep C antibody, HBsAg and RPR Check urine chlamydia and gonorrhea Check UA Advised to use barrier contraceptive to prevent STD      Relevant  Orders   HIV antibody (with reflex)   Hepatitis C Antibody   Hepatitis B Surface AntiGEN   RPR   Chlamydia/GC NAA, Confirmation   UA/M w/rflx Culture, Routine     Meds ordered this encounter  Medications   omeprazole (  PRILOSEC) 20 MG capsule    Sig: Take 1 capsule (20 mg total) by mouth daily as needed (Acid reflux/heartburn).    Dispense:  30 capsule    Refill:  3     Mick Tanguma Keith Rake, MD

## 2022-10-29 LAB — UA/M W/RFLX CULTURE, ROUTINE
Bilirubin, UA: NEGATIVE
Glucose, UA: NEGATIVE
Leukocytes,UA: NEGATIVE
Nitrite, UA: NEGATIVE
RBC, UA: NEGATIVE
Urobilinogen, Ur: 1 mg/dL (ref 0.2–1.0)
pH, UA: 6.5 (ref 5.0–7.5)

## 2022-10-29 LAB — MICROSCOPIC EXAMINATION
Bacteria, UA: NONE SEEN
Casts: NONE SEEN /lpf
Epithelial Cells (non renal): NONE SEEN /hpf (ref 0–10)

## 2022-10-29 LAB — HEPATITIS C ANTIBODY: Hep C Virus Ab: NONREACTIVE

## 2022-10-29 LAB — HIV ANTIBODY (ROUTINE TESTING W REFLEX): HIV Screen 4th Generation wRfx: NONREACTIVE

## 2022-10-29 LAB — URINE CULTURE, REFLEX: Organism ID, Bacteria: NO GROWTH

## 2022-10-29 LAB — SYPHILIS: RPR W/REFLEX TO RPR TITER AND TREPONEMAL ANTIBODIES, TRADITIONAL SCREENING AND DIAGNOSIS ALGORITHM: RPR Ser Ql: NONREACTIVE

## 2022-10-29 LAB — HEPATITIS B SURFACE ANTIGEN: Hepatitis B Surface Ag: NEGATIVE

## 2022-10-30 ENCOUNTER — Other Ambulatory Visit: Payer: Self-pay | Admitting: Internal Medicine

## 2022-10-30 DIAGNOSIS — A749 Chlamydial infection, unspecified: Secondary | ICD-10-CM

## 2022-10-30 MED ORDER — AZITHROMYCIN 500 MG PO TABS: 1000.0000 mg | ORAL_TABLET | Freq: Every day | ORAL | 0 refills | Status: AC

## 2022-10-31 ENCOUNTER — Telehealth: Payer: Self-pay | Admitting: Internal Medicine

## 2022-10-31 NOTE — Telephone Encounter (Signed)
Pt returned call

## 2022-10-31 NOTE — Telephone Encounter (Signed)
Left message

## 2022-11-01 LAB — CHLAMYDIA/GC NAA, CONFIRMATION
Chlamydia trachomatis, NAA: POSITIVE — AB
Neisseria gonorrhoeae, NAA: NEGATIVE

## 2022-11-01 LAB — C. TRACHOMATIS NAA, CONFIRM: C. trachomatis NAA, Confirm: POSITIVE — AB

## 2023-01-31 ENCOUNTER — Encounter: Payer: Commercial Managed Care - HMO | Admitting: Internal Medicine

## 2023-03-02 DIAGNOSIS — Z0000 Encounter for general adult medical examination without abnormal findings: Secondary | ICD-10-CM | POA: Diagnosis not present

## 2023-03-12 ENCOUNTER — Ambulatory Visit
Admission: EM | Admit: 2023-03-12 | Discharge: 2023-03-12 | Disposition: A | Discharge status: Home / Self Care | Payer: Medicaid Other | Attending: Nurse Practitioner | Admitting: Nurse Practitioner

## 2023-03-12 DIAGNOSIS — Z113 Encounter for screening for infections with a predominantly sexual mode of transmission: Secondary | ICD-10-CM | POA: Diagnosis not present

## 2023-03-12 DIAGNOSIS — Z202 Contact with and (suspected) exposure to infections with a predominantly sexual mode of transmission: Secondary | ICD-10-CM | POA: Diagnosis not present

## 2023-03-12 NOTE — ED Provider Notes (Signed)
[facility]    CSN: [id number] Arrival date & time: 03/12/23  1726      History   Chief Complaint No chief complaint on file.   HPI Marvin Marks is a 22 y.o. male.   The history is provided by the patient.   The patient presents for STI testing.  Patient reports recent exposure to chlamydia.  Patient denies penile discharge, urinary frequency, urgency, hesitancy, hematuria, decreased urine stream, scrotal/testicular pain/swelling, and abdominal pain.  Patient reports 1 male partner in the past 90 days.  Denies prior history of STI/STD.  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 Authorizing Provider  albuterol (VENTOLIN HFA) 108 (90 Base) MCG/ACT inhaler Inhale 2 puffs into the lungs every 4 (four) hours as needed for wheezing or shortness of breath. 11/24/21   Anabel Halon, MD  azithromycin (ZITHROMAX) 500 MG tablet Take 2 tablets (1,000 mg total) by mouth daily. 10/30/22   Anabel Halon, MD  cetirizine (ZYRTEC) 10 MG tablet Take 1 tablet (10 mg total) by mouth daily. 11/24/21   Anabel Halon, MD  fluticasone (FLONASE) 50 MCG/ACT nasal spray Place 2 sprays into both nostrils daily. 11/24/21   Anabel Halon, MD  fluticasone (FLOVENT HFA) 110 MCG/ACT inhaler Inhale 1 puff into the lungs daily. 11/24/21   Anabel Halon, MD  Melatonin 3 MG TABS  Take 1 tablet by mouth at bedtime. Patient not taking: Reported on 07/02/2021    [provider]  montelukast (SINGULAIR) 10 MG tablet Take 1 tablet (10 mg total) by mouth at bedtime. 11/24/21   Anabel Halon, MD  Olopatadine HCl (PATADAY) 0.2 % SOLN Apply 1 drop to eye daily as needed (FOR ALLERGIES). 03/03/21   Vella Kohler, MD  omeprazole (PRILOSEC) 20 MG capsule Take 1 capsule (20 mg total) by mouth daily as needed (Acid reflux/heartburn). 10/26/22   Anabel Halon, MD    Family History Family History  Problem Relation Age of Onset   Asthma Father    Asthma Maternal Grandmother    Allergic rhinitis Neg Hx    Angioedema Neg Hx    Eczema Neg Hx    Immunodeficiency Neg Hx    Urticaria Neg Hx    Atopy Neg Hx     Social History Social History   Tobacco Use   Smoking status: Never   Smokeless tobacco: Never  Vaping Use   Vaping status: Never Used  Substance Use Topics   Alcohol use: No   Drug use: Yes    Types: Marijuana     Allergies   Patient has no known allergies.   Review of Systems Review of Systems Per HPI  Physical Exam Triage Vital Signs ED Triage Vitals  Encounter Vitals Group     BP 03/12/23 1908 124/87  Systolic BP Percentile --      Diastolic BP Percentile --      Pulse Rate 03/12/23 1908 (!) 55     Resp 03/12/23 1908 13     Temp 03/12/23 1908 98.2 F (36.8 C)     Temp Source 03/12/23 1908 Oral     SpO2 03/12/23 1908 98 %     Weight --      Height --      Head Circumference --      Peak Flow --      Pain Score 03/12/23 1910 0     Pain Loc --      Pain Education --      Exclude from Growth Chart --    No data found.  Updated Vital Signs BP 124/87 (BP Location: Right Arm)   Pulse (!) 55   Temp 98.2 F (36.8 C) (Oral)   Resp 13   SpO2 98%   Visual Acuity Right Eye Distance:   Left Eye Distance:   Bilateral Distance:    Right Eye Near:   Left Eye Near:    Bilateral Near:     Physical Exam Vitals and nursing  note reviewed.  Constitutional:      General: He is not in acute distress.    Appearance: Normal appearance.  HENT:     Head: Normocephalic.  Eyes:     Extraocular Movements: Extraocular movements intact.     Pupils: Pupils are equal, round, and reactive to light.  Cardiovascular:     Pulses: Normal pulses.     Heart sounds: Normal heart sounds.  Pulmonary:     Effort: Pulmonary effort is normal.  Abdominal:     General: Bowel sounds are normal.     Palpations: Abdomen is soft.     Tenderness: There is no abdominal tenderness.  Musculoskeletal:     Cervical back: Normal range of motion.  Skin:    General: Skin is warm and dry.  Neurological:     General: No focal deficit present.     Mental Status: He is alert and oriented to person, place, and time.  Psychiatric:        Mood and Affect: Mood normal.        Behavior: Behavior normal.      UC Treatments / Results  Labs (all labs ordered are listed, but only abnormal results are displayed) Labs Reviewed  CYTOLOGY, (ORAL, ANAL, URETHRAL) ANCILLARY ONLY    EKG   Radiology No results found.  Procedures Procedures (including critical care time)  Medications Ordered in UC Medications - No data to display  Initial Impression / Assessment and Plan / UC Course  I have reviewed the triage vital signs and the nursing notes.  Pertinent labs & imaging results that were available during my care of the patient were reviewed by me and considered in my medical decision making (see chart for details).  The patient is well-appearing, he is in no acute distress, vital signs are stable.  STI testing is pending.  Patient was advised he will be contacted if the pending test results are abnormal, he will also have access to his results via MyChart.  Patient was provided education to refrain from sexual intercourse if his test are positive for 7 days after treatment.  Patient also advised to increase his condom use.  Patient was in  agreement with this plan of care and verbalizes understanding.  All questions were answered.  Patient stable for discharge.  Final Clinical Impressions(s) / UC Diagnoses   Final diagnoses:  Screening examination for sexually transmitted disease     Discharge Instructions      STI swab is pending.  As discussed, you will be contacted if the pending test results are abnormal.  You also have access to your test results via MyChart.  If you test positive for an STI, you will need to refrain from sexual intercourse for 7 days after completing treatment.  It is also recommended that you notify all partners if you test positive. Increase condom use with each sexual encounter. Follow-up as needed.     ED Prescriptions   None    PDMP not reviewed this encounter.   Abran Cantor, NP 03/12/23 1941

## 2023-03-12 NOTE — ED Triage Notes (Signed)
Pt c/o needing STD testing. Pt has exposure to Chlamydia. Denies sx's

## 2023-03-12 NOTE — Discharge Instructions (Signed)
STI swab is pending.  As discussed, you will be contacted if the pending test results are abnormal.  You also have access to your test results via MyChart.  If you test positive for an STI, you will need to refrain from sexual intercourse for 7 days after completing treatment.  It is also recommended that you notify all partners if you test positive. Increase condom use with each sexual encounter. Follow-up as needed.

## 2023-03-20 DIAGNOSIS — R030 Elevated blood-pressure reading, without diagnosis of hypertension: Secondary | ICD-10-CM | POA: Diagnosis not present

## 2023-03-20 DIAGNOSIS — Z833 Family history of diabetes mellitus: Secondary | ICD-10-CM | POA: Diagnosis not present

## 2023-03-20 DIAGNOSIS — Z5982 Transportation insecurity: Secondary | ICD-10-CM | POA: Diagnosis not present

## 2023-04-02 ENCOUNTER — Telehealth: Payer: Self-pay | Admitting: Internal Medicine

## 2023-04-02 NOTE — Telephone Encounter (Signed)
Patient called need copy of shot records sent to his school, asked to fax them if possible, [employer] fax # [phone] or call patient to pick up. Contact patient either way.

## 2023-04-02 NOTE — Telephone Encounter (Signed)
Faxed, patient advised.

## 2023-04-05 ENCOUNTER — Encounter: Payer: Medicaid Other | Admitting: Internal Medicine

## 2023-04-06 ENCOUNTER — Encounter: Payer: Self-pay | Admitting: Internal Medicine

## 2023-05-22 ENCOUNTER — Encounter: Payer: Self-pay | Admitting: Internal Medicine

## 2023-07-10 ENCOUNTER — Ambulatory Visit
Admission: EM | Admit: 2023-07-10 | Discharge: 2023-07-10 | Disposition: A | Discharge status: Home / Self Care | Payer: 59 | Attending: Family Medicine | Admitting: Family Medicine

## 2023-07-10 ENCOUNTER — Encounter: Payer: Self-pay | Admitting: Emergency Medicine

## 2023-07-10 ENCOUNTER — Other Ambulatory Visit: Payer: Self-pay

## 2023-07-10 DIAGNOSIS — N489 Disorder of penis, unspecified: Secondary | ICD-10-CM | POA: Insufficient documentation

## 2023-07-10 DIAGNOSIS — L731 Pseudofolliculitis barbae: Secondary | ICD-10-CM | POA: Diagnosis not present

## 2023-07-10 DIAGNOSIS — Z113 Encounter for screening for infections with a predominantly sexual mode of transmission: Secondary | ICD-10-CM | POA: Diagnosis not present

## 2023-07-10 NOTE — ED Provider Notes (Signed)
[facility]    CSN: [id number] Arrival date & time: 07/10/23  1234      History   Chief Complaint Chief Complaint  Patient presents with   Skin Problem    HPI Marvin Marks is a 22 y.o. male.   Patient presenting today with a bump to the shaft of his penis that he first noticed about 2 days ago.  He states he has not tried anything for it but it seems to be getting better.  Denies drainage, bleeding, pain, itching, new products used.  Requesting STD screening.  Does shave this area.    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 Authorizing Provider  albuterol (VENTOLIN HFA) 108 (90 Base) MCG/ACT inhaler Inhale 2 puffs into the lungs every 4 (four) hours as needed for wheezing or shortness of breath. 11/24/21   Anabel Halon, MD  azithromycin (ZITHROMAX) 500 MG tablet Take 2 tablets (1,000 mg total) by mouth daily. 10/30/22   Anabel Halon, MD  cetirizine (ZYRTEC) 10 MG tablet Take 1 tablet (10 mg total) by mouth daily. 11/24/21   Anabel Halon, MD  fluticasone (FLONASE) 50 MCG/ACT nasal spray Place 2 sprays into both nostrils daily. 11/24/21   Anabel Halon, MD  fluticasone (FLOVENT HFA) 110 MCG/ACT inhaler Inhale 1 puff into the lungs daily. 11/24/21   Anabel Halon, MD  Melatonin 3 MG TABS Take 1 tablet by mouth at bedtime. Patient not  taking: Reported on 07/02/2021    [provider]  montelukast (SINGULAIR) 10 MG tablet Take 1 tablet (10 mg total) by mouth at bedtime. 11/24/21   Anabel Halon, MD  Olopatadine HCl (PATADAY) 0.2 % SOLN Apply 1 drop to eye daily as needed (FOR ALLERGIES). 03/03/21   Vella Kohler, MD  omeprazole (PRILOSEC) 20 MG capsule Take 1 capsule (20 mg total) by mouth daily as needed (Acid reflux/heartburn). 10/26/22   Anabel Halon, MD    Family History Family History  Problem Relation Age of Onset   Asthma Father    Asthma Maternal Grandmother    Allergic rhinitis Neg Hx    Angioedema Neg Hx    Eczema Neg Hx    Immunodeficiency Neg Hx    Urticaria Neg Hx    Atopy Neg Hx     Social History Social History   Tobacco Use   Smoking status: Never   Smokeless tobacco: Never  Vaping Use   Vaping status: Never Used  Substance Use Topics   Alcohol use: No   Drug use: Yes    Types: Marijuana    Comment: occ     Allergies   Patient has no known allergies.   Review of Systems Review of Systems Per HPI  Physical Exam Triage Vital Signs ED Triage Vitals  Encounter Vitals Group  BP 07/10/23 1459 99/61     Systolic BP Percentile --      Diastolic BP Percentile --      Pulse Rate 07/10/23 1459 81     Resp 07/10/23 1459 20     Temp 07/10/23 1459 98.6 F (37 C)     Temp Source 07/10/23 1459 Oral     SpO2 07/10/23 1459 97 %     Weight --      Height --      Head Circumference --      Peak Flow --      Pain Score 07/10/23 1458 0     Pain Loc --      Pain Education --      Exclude from Growth Chart --    No data found.  Updated Vital Signs BP 99/61 (BP Location: Right Arm)   Pulse 81   Temp 98.6 F (37 C) (Oral)   Resp 20   SpO2 97%   Visual Acuity Right Eye Distance:   Left Eye Distance:   Bilateral Distance:    Right Eye Near:   Left Eye Near:    Bilateral Near:     Physical Exam Vitals and nursing note reviewed. Exam conducted with a chaperone  present.  Constitutional:      Appearance: Normal appearance.  HENT:     Head: Atraumatic.  Eyes:     Extraocular Movements: Extraocular movements intact.     Conjunctiva/sclera: Conjunctivae normal.  Cardiovascular:     Rate and Rhythm: Normal rate and regular rhythm.  Pulmonary:     Effort: Pulmonary effort is normal.     Breath sounds: Normal breath sounds.  Genitourinary:    Comments: Small flesh-colored papular area to the shaft of penis posteriorly.  No fluctuance, induration, drainage, bleeding Musculoskeletal:        General: Normal range of motion.     Cervical back: Normal range of motion and neck supple.  Skin:    General: Skin is warm and dry.  Neurological:     General: No focal deficit present.     Mental Status: He is oriented to person, place, and time.  Psychiatric:        Mood and Affect: Mood normal.        Thought Content: Thought content normal.        Judgment: Judgment normal.      UC Treatments / Results  Labs (all labs ordered are listed, but only abnormal results are displayed) Labs Reviewed  CYTOLOGY, (ORAL, ANAL, URETHRAL) ANCILLARY ONLY    EKG   Radiology No results found.  Procedures Procedures (including critical care time)  Medications Ordered in UC Medications - No data to display  Initial Impression / Assessment and Plan / UC Course  I have reviewed the triage vital signs and the nursing notes.  Pertinent labs & imaging results that were available during my care of the patient were reviewed by me and considered in my medical decision making (see chart for details).     Pathology swab pending for screening but very low suspicion for this being related to a sexually transmitted infection.  Most consistent with an ingrown hair which is likely since he shaves the area.  Discussed warm compresses, Neosporin and avoidance of shaving in this area.  Final Clinical Impressions(s) / UC Diagnoses   Final diagnoses:  Ingrown hair   Penile lesion  Screening examination for STI     Discharge Instructions  May apply warm compresses, Neosporin to the area and try to avoid shaving as this can cause these irritated areas to occur.  We will let you know if anything comes back positive on your STD swab    ED Prescriptions   None    PDMP not reviewed this encounter.   Particia Nearing, [place] 07/10/23 1521

## 2023-07-10 NOTE — ED Triage Notes (Addendum)
Pt reports had unprotected intercourse approximately 1 month ago and reports would like to be tested for STD. Pt reports noticed "bump on genitals" a few days ago.   Penile swab obtained in triage.

## 2023-07-10 NOTE — Discharge Instructions (Addendum)
May apply warm compresses, Neosporin to the area and try to avoid shaving as this can cause these irritated areas to occur.  We will let you know if anything comes back positive on your STD swab

## 2023-07-11 LAB — CYTOLOGY, (ORAL, ANAL, URETHRAL) ANCILLARY ONLY
Chlamydia: NEGATIVE
Comment: NEGATIVE
Comment: NEGATIVE
Comment: NORMAL
Neisseria Gonorrhea: NEGATIVE
Trichomonas: NEGATIVE

## 2023-08-27 ENCOUNTER — Other Ambulatory Visit: Payer: Self-pay

## 2023-08-27 ENCOUNTER — Encounter (HOSPITAL_COMMUNITY): Payer: Self-pay | Admitting: Emergency Medicine

## 2023-08-27 ENCOUNTER — Emergency Department (HOSPITAL_COMMUNITY)
Admission: EM | Admit: 2023-08-27 | Discharge: 2023-08-27 | Disposition: A | Discharge status: Home / Self Care | Payer: 59 | Attending: Emergency Medicine | Admitting: Emergency Medicine

## 2023-08-27 DIAGNOSIS — L089 Local infection of the skin and subcutaneous tissue, unspecified: Secondary | ICD-10-CM | POA: Insufficient documentation

## 2023-08-27 MED ORDER — DOXYCYCLINE HYCLATE 100 MG PO CAPS: 100.0000 mg | ORAL_CAPSULE | Freq: Two times a day (BID) | ORAL | 0 refills | Status: AC

## 2023-08-27 NOTE — Discharge Instructions (Addendum)
 I believe that the bump on your penis is likely from an infected hair follicle.  Please avoid shaving in this area, and take the antibiotic as prescribed.  If your symptoms get worse, please return to the ER.  Please keep the area clean and dry, and watch for any redness, swelling, warmth

## 2023-08-27 NOTE — ED Triage Notes (Signed)
 Pt reports discomfort to penis a few weeks ago. States he went to [facility] and had STI screening that was normal. Reports irration started back up yesterday. Denies discharge or swelling.

## 2023-08-27 NOTE — ED Provider Notes (Signed)
 [place] EMERGENCY DEPARTMENT AT [facility] Provider Note   CSN: [id number] Arrival date & time: 08/27/23  1433     History  Chief Complaint  Patient presents with   Warts    Marvin Marks is a 23 y.o. male, history of chlamydia, who presents to the ED secondary to a bump on his penis that is been going on for the last few weeks.  He does shave in this area, but denies any new sexual partners, and recently got STD testing, was negative per patient.  Denies any penile discharge, pain with urination, testicular pain.    Home Medications Prior to Admission medications   Medication Sig Start Date End Date Taking? Authorizing Provider  doxycycline  (VIBRAMYCIN ) 100 MG capsule Take 1 capsule (100 mg total) by mouth 2 (two) times daily. 08/27/23  Yes Sosaia Pittinger L, PA  albuterol  (VENTOLIN  HFA) 108 (90 Base) MCG/ACT inhaler Inhale 2 puffs into the lungs every 4 (four) hours as needed for wheezing or shortness of breath. 11/24/21   Tobie Suzzane MARLA, MD  azithromycin  (ZITHROMAX ) 500 MG tablet Take 2 tablets (1,000 mg total) by mouth daily. 10/30/22   Tobie Suzzane MARLA, MD  cetirizine  (ZYRTEC ) 10 MG tablet Take 1 tablet (10 mg total) by mouth daily. 11/24/21   Tobie Suzzane MARLA, MD  fluticasone  (FLONASE ) 50 MCG/ACT nasal spray Place 2 sprays into both nostrils daily. 11/24/21   Tobie Suzzane MARLA, MD  fluticasone  (FLOVENT  HFA) 110 MCG/ACT inhaler Inhale 1 puff into the lungs daily. 11/24/21   Tobie Suzzane MARLA, MD  Melatonin 3 MG TABS Take 1 tablet by mouth at bedtime. Patient not taking: Reported on 07/02/2021    [provider]  montelukast  (SINGULAIR ) 10 MG tablet Take 1 tablet (10 mg total) by mouth at bedtime. 11/24/21   Tobie Suzzane MARLA, MD  Olopatadine  HCl (PATADAY ) 0.2 % SOLN Apply 1 drop to eye daily as needed (FOR ALLERGIES). 03/03/21   Lord Edgardo RAMAN, MD  omeprazole  (PRILOSEC) 20 MG capsule Take 1 capsule (20 mg total) by mouth daily as needed (Acid reflux/heartburn). 10/26/22    Tobie Suzzane MARLA, MD      Allergies    Patient has no known allergies.    Review of Systems   Review of Systems  Genitourinary:  Negative for penile discharge and testicular pain.  Skin:  Positive for rash.    Physical Exam Updated Vital Signs BP 129/61   Pulse (!) 52   Temp 99 F (37.2 C) (Oral)   Resp 18   Ht 5' 9 (1.753 m)   Wt 67 kg   SpO2 100%   BMI 21.81 kg/m  Physical Exam Vitals and nursing note reviewed.  Constitutional:      General: He is not in acute distress.    Appearance: He is well-developed.  HENT:     Head: Normocephalic and atraumatic.  Eyes:     Conjunctiva/sclera: Conjunctivae normal.  Cardiovascular:     Rate and Rhythm: Normal rate and regular rhythm.     Heart sounds: No murmur heard. Pulmonary:     Effort: Pulmonary effort is normal. No respiratory distress.     Breath sounds: Normal breath sounds.  Abdominal:     Palpations: Abdomen is soft.     Tenderness: There is no abdominal tenderness.  Genitourinary:    Comments: Chaperone present, patient has a pustule, on the left posterior shaft of the penis.  It is round half centimeter by half centimeter.  No  penile discharge, no other rashes. Musculoskeletal:        General: No swelling.     Cervical back: Neck supple.  Skin:    General: Skin is warm and dry.     Capillary Refill: Capillary refill takes less than 2 seconds.  Neurological:     Mental Status: He is alert.  Psychiatric:        Mood and Affect: Mood normal.     ED Results / Procedures / Treatments   Labs (all labs ordered are listed, but only abnormal results are displayed) Labs Reviewed - No data to display  EKG None  Radiology No results found.  Procedures .Incision and Drainage  Date/Time: 08/27/2023 3:09 PM  Performed by: Philippa Lyle CROME, PA Authorized by: Philippa Lyle CROME, PA   Consent:    Consent obtained:  Verbal   Consent given by:  Patient   Risks, benefits, and alternatives were discussed: yes      Risks discussed:  Bleeding, damage to other organs, infection, incomplete drainage and pain   Alternatives discussed:  Alternative treatment Universal protocol:    Patient identity confirmed:  Verbally with patient Location:    Type:  Abscess   Size:  .5x.5cm   Location: penis. Pre-procedure details:    Skin preparation:  Chlorhexidine with alcohol Sedation:    Sedation type:  None Anesthesia:    Anesthesia method:  None Procedure type:    Complexity:  Simple Procedure details:    Incision type: stab incision w/18 gauge needle.   Drainage:  Purulent and bloody   Drainage amount:  Moderate Post-procedure details:    Procedure completion:  Tolerated     Medications Ordered in ED Medications - No data to display  ED Course/ Medical Decision Making/ A&P                                 Medical Decision Making Patient is a 23 year old male, here for penile discomfort, this been going on for the last few weeks.  He does shave around his penis and his shaft.  He on exam he has a pustule noted, on the lower left posterior shaft.  I used an 18-gauge needle, to drain the area, with some purulent drainage as well as blood.  We will place him on doxycycline  for further management.  There is no ulcerations, or skin breakdown.  Has no penile discharge, and is recently had negative STD check.  We discussed return precautions and discharged home  Risk Prescription drug management.    Final Clinical Impression(s) / ED Diagnoses Final diagnoses:  Skin pustule    Rx / DC Orders ED Discharge Orders          Ordered    doxycycline  (VIBRAMYCIN ) 100 MG capsule  2 times daily        08/27/23 1512              Noelle Hoogland, [place], [place] 08/27/23 1515    Melvenia Motto, MD 08/27/23 1641

## 2023-12-10 ENCOUNTER — Emergency Department (HOSPITAL_COMMUNITY)
Admission: EM | Admit: 2023-12-10 | Discharge: 2023-12-10 | Disposition: A | Discharge status: Home / Self Care | Attending: Emergency Medicine | Admitting: Emergency Medicine

## 2023-12-10 ENCOUNTER — Other Ambulatory Visit: Payer: Self-pay

## 2023-12-10 ENCOUNTER — Encounter (HOSPITAL_COMMUNITY): Payer: Self-pay | Admitting: Emergency Medicine

## 2023-12-10 ENCOUNTER — Emergency Department (HOSPITAL_COMMUNITY)

## 2023-12-10 DIAGNOSIS — M545 Low back pain, unspecified: Secondary | ICD-10-CM

## 2023-12-10 DIAGNOSIS — M546 Pain in thoracic spine: Secondary | ICD-10-CM | POA: Insufficient documentation

## 2023-12-10 DIAGNOSIS — R82998 Other abnormal findings in urine: Secondary | ICD-10-CM | POA: Diagnosis not present

## 2023-12-10 DIAGNOSIS — M549 Dorsalgia, unspecified: Secondary | ICD-10-CM | POA: Diagnosis not present

## 2023-12-10 DIAGNOSIS — M5459 Other low back pain: Secondary | ICD-10-CM | POA: Diagnosis not present

## 2023-12-10 LAB — URINALYSIS, ROUTINE W REFLEX MICROSCOPIC
Bacteria, UA: NONE SEEN
Bilirubin Urine: NEGATIVE
Glucose, UA: NEGATIVE mg/dL
Hgb urine dipstick: NEGATIVE
Ketones, ur: 20 mg/dL — AB
Leukocytes,Ua: NEGATIVE
Nitrite: NEGATIVE
Protein, ur: 30 mg/dL — AB
Specific Gravity, Urine: 1.031 — ABNORMAL HIGH (ref 1.005–1.030)
pH: 6 (ref 5.0–8.0)

## 2023-12-10 MED ORDER — CYCLOBENZAPRINE HCL 10 MG PO TABS: 10.0000 mg | ORAL_TABLET | Freq: Two times a day (BID) | ORAL | 0 refills | Status: AC | PRN

## 2023-12-10 NOTE — Discharge Instructions (Signed)
 Begin taking ibuprofen  600 mg every 6 hours as needed for pain.  Begin taking Flexeril as prescribed as needed for pain not relieved with ibuprofen .  Drink plenty of fluids.  Follow-up with primary doctor if symptoms are not improving in the next week.

## 2023-12-10 NOTE — ED Provider Notes (Signed)
 [place] EMERGENCY DEPARTMENT AT [facility] Provider Note   CSN: [id number] Arrival date & time: 12/10/23  0137     History  Chief Complaint  Patient presents with   Back Pain    Marvin Marks is a 23 y.o. male.  Patient is a 23 year old male presenting with complaints of back pain.  He describes a 1 week history of pain to his mid back that began in the absence of any injury or trauma.  Pain is constant, but not worse with bending or changing position.  He reports noticing dark urine this evening.  No burning with urination.  No diarrhea or constipation.       Home Medications Prior to Admission medications   Medication Sig Start Date End Date Taking? Authorizing Provider  albuterol  (VENTOLIN  HFA) 108 (90 Base) MCG/ACT inhaler Inhale 2 puffs into the lungs every 4 (four) hours as needed for wheezing or shortness of breath. 11/24/21   Meldon Sport, MD  azithromycin  (ZITHROMAX ) 500 MG tablet Take 2 tablets (1,000 mg total) by mouth daily. 10/30/22   Meldon Sport, MD  cetirizine  (ZYRTEC ) 10 MG tablet Take 1 tablet (10 mg total) by mouth daily. 11/24/21   Meldon Sport, MD  doxycycline  (VIBRAMYCIN ) 100 MG capsule Take 1 capsule (100 mg total) by mouth 2 (two) times daily. 08/27/23   Small, Brooke L, PA  fluticasone  (FLONASE ) 50 MCG/ACT nasal spray Place 2 sprays into both nostrils daily. 11/24/21   Meldon Sport, MD  fluticasone  (FLOVENT  HFA) 110 MCG/ACT inhaler Inhale 1 puff into the lungs daily. 11/24/21   Meldon Sport, MD  Melatonin 3 MG TABS Take 1 tablet by mouth at bedtime. Patient not taking: Reported on 07/02/2021    [provider]  montelukast  (SINGULAIR ) 10 MG tablet Take 1 tablet (10 mg total) by mouth at bedtime. 11/24/21   Meldon Sport, MD  Olopatadine  HCl (PATADAY ) 0.2 % SOLN Apply 1 drop to eye daily as needed (FOR ALLERGIES). 03/03/21   Qayumi, Zainab S, MD  omeprazole  (PRILOSEC) 20 MG capsule Take 1 capsule (20 mg total) by mouth  daily as needed (Acid reflux/heartburn). 10/26/22   Meldon Sport, MD      Allergies    Patient has no known allergies.    Review of Systems   Review of Systems  All other systems reviewed and are negative.   Physical Exam Updated Vital Signs BP 120/76 (BP Location: Right Arm)   Pulse (!) 54   Temp 98.4 F (36.9 C) (Oral)   Resp 16   Ht 5\' 9"  (1.753 m)   Wt 72.6 kg   SpO2 100%   BMI 23.63 kg/m  Physical Exam Vitals and nursing note reviewed.  Constitutional:      General: He is not in acute distress.    Appearance: He is well-developed. He is not diaphoretic.  HENT:     Head: Normocephalic and atraumatic.  Cardiovascular:     Rate and Rhythm: Normal rate and regular rhythm.     Heart sounds: No murmur heard.    No friction rub.  Pulmonary:     Effort: Pulmonary effort is normal. No respiratory distress.     Breath sounds: Normal breath sounds. No wheezing or rales.  Abdominal:     General: Bowel sounds are normal. There is no distension.     Palpations: Abdomen is soft.     Tenderness: There is no abdominal tenderness.  Musculoskeletal:  General: Normal range of motion.     Cervical back: Normal range of motion and neck supple.  Skin:    General: Skin is warm and dry.  Neurological:     Mental Status: He is alert and oriented to person, place, and time.     Coordination: Coordination normal.     ED Results / Procedures / Treatments   Labs (all labs ordered are listed, but only abnormal results are displayed) Labs Reviewed - No data to display  EKG None  Radiology No results found.  Procedures Procedures    Medications Ordered in ED Medications - No data to display  ED Course/ Medical Decision Making/ A&P  Patient is a 23 year old male presenting with mid back pain for the past week.  He arrives here with stable vital signs and is afebrile.  Physical examination basically unremarkable.  Abdomen is benign.  Urinalysis obtained showing  specific gravity of 1.03, but is otherwise unremarkable.  There is no evidence for infection.  CT with renal protocol obtained showing no acute abnormality.  Because of patient's discomfort unclear, but likely musculoskeletal.  He will be given Flexeril and advised to rest and follow-up with primary doctor if not improving.  Final Clinical Impression(s) / ED Diagnoses Final diagnoses:  None    Rx / DC Orders ED Discharge Orders     None         Orvilla Blander, MD 12/10/23 [phone]

## 2023-12-10 NOTE — ED Notes (Signed)
 Patient transported to CT

## 2023-12-10 NOTE — ED Triage Notes (Signed)
 Pt with c/o upper back pain x 1 week. Denies any known injury. States urine is dark in color but denies any other urinary symptoms.

## 2024-02-11 ENCOUNTER — Ambulatory Visit
Admission: EM | Admit: 2024-02-11 | Discharge: 2024-02-11 | Disposition: A | Discharge status: Home / Self Care | Attending: Nurse Practitioner | Admitting: Nurse Practitioner

## 2024-02-11 DIAGNOSIS — Z202 Contact with and (suspected) exposure to infections with a predominantly sexual mode of transmission: Secondary | ICD-10-CM | POA: Diagnosis not present

## 2024-02-11 DIAGNOSIS — S91332A Puncture wound without foreign body, left foot, initial encounter: Secondary | ICD-10-CM

## 2024-02-11 MED ORDER — AMOXICILLIN-POT CLAVULANATE 875-125 MG PO TABS: 1.0000 | ORAL_TABLET | Freq: Two times a day (BID) | ORAL | 0 refills | Status: AC

## 2024-02-11 NOTE — ED Provider Notes (Signed)
 [facility]    CSN: [id number] Arrival date & time: 02/11/24  1330      History   Chief Complaint Chief Complaint  Patient presents with   Foot Pain    HPI ELISANDRO JARRETT is a 23 y.o. male.   The history is provided by the patient.   Patient presents for complaints of left foot pain after he stepped on a nail approximately 4 days ago.  Patient states that after the injury, he cleaned the foot, and that it remains sore.  He states that he wanted to come in today to get it checked out.  Patient denies fever, chills, left foot pain, swelling, oozing, or drainage from the left foot.  States that his last tetanus shot was approximately 1 year ago.  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 Authorizing Provider  albuterol  (VENTOLIN  HFA) 108 (90 Base) MCG/ACT inhaler Inhale 2 puffs into the lungs every 4 (four) hours as needed for wheezing or shortness of breath. 11/24/21   Tobie Suzzane MARLA, MD  azithromycin  (ZITHROMAX ) 500 MG tablet Take 2 tablets (1,000 mg total) by mouth daily. 10/30/22   Tobie Suzzane MARLA, MD  cetirizine  (ZYRTEC ) 10 MG tablet Take 1 tablet (10 mg total) by mouth daily. 11/24/21   Tobie Suzzane MARLA, MD  cyclobenzaprine  (FLEXERIL ) 10 MG tablet Take 1 tablet (10 mg total) by mouth 2 (two) times daily as needed for muscle spasms. 12/10/23   Geroldine Berg, MD  doxycycline  (VIBRAMYCIN ) 100 MG capsule Take 1 capsule (100 mg total) by mouth 2 (two) times daily. 08/27/23   Small, Brooke L, PA  fluticasone  (FLONASE ) 50 MCG/ACT nasal spray Place 2 sprays into both nostrils daily. 11/24/21   Tobie Suzzane MARLA, MD  fluticasone  (FLOVENT  HFA) 110 MCG/ACT inhaler Inhale 1 puff into the lungs daily. 11/24/21   Tobie Suzzane MARLA, MD  Melatonin 3 MG TABS Take 1 tablet by mouth at bedtime. Patient not taking: Reported on 07/02/2021    [provider]  montelukast  (SINGULAIR ) 10 MG tablet Take 1 tablet (10 mg total) by mouth at bedtime. 11/24/21   Tobie Suzzane MARLA, MD  Olopatadine  HCl (PATADAY ) 0.2 % SOLN Apply 1 drop to eye daily as needed (FOR ALLERGIES). 03/03/21   Qayumi, Zainab S, MD  omeprazole  (PRILOSEC) 20 MG capsule Take 1 capsule (20 mg total) by mouth daily as needed (Acid reflux/heartburn). 10/26/22   Tobie Suzzane MARLA, MD    Family History Family History  Problem Relation Age of Onset   Asthma Father    Asthma Maternal Grandmother    Allergic rhinitis Neg Hx    Angioedema Neg Hx    Eczema Neg Hx    Immunodeficiency Neg Hx    Urticaria Neg Hx    Atopy Neg Hx     Social History Social  History   Tobacco Use   Smoking status: Never   Smokeless tobacco: Never  Vaping Use   Vaping status: Never Used  Substance Use Topics   Alcohol use: No   Drug use: Yes    Types: Marijuana    Comment: occ     Allergies   Patient has no known allergies.   Review of Systems Review of Systems Per HPI  Physical Exam Triage Vital Signs ED Triage Vitals [02/11/24 1407]  Encounter Vitals Group     BP 127/83     Girls Systolic BP Percentile      Girls Diastolic BP Percentile      Boys Systolic BP Percentile      Boys Diastolic BP Percentile      Pulse Rate 74     Resp 16     Temp 98.5 F (36.9 C)     Temp Source Oral     SpO2 97 %     Weight      Height      Head Circumference      Peak Flow      Pain Score      Pain Loc       Pain Education      Exclude from Growth Chart    No data found.  Updated Vital Signs BP 127/83 (BP Location: Right Arm)   Pulse 74   Temp 98.5 F (36.9 C) (Oral)   Resp 16   SpO2 97%   Visual Acuity Right Eye Distance:   Left Eye Distance:   Bilateral Distance:    Right Eye Near:   Left Eye Near:    Bilateral Near:     Physical Exam Vitals and nursing note reviewed.  Constitutional:      General: He is not in acute distress.    Appearance: Normal appearance.  HENT:     Head: Normocephalic.   Eyes:     Extraocular Movements: Extraocular movements intact.     Pupils: Pupils are equal, round, and reactive to light.   Pulmonary:     Effort: Pulmonary effort is normal.   Musculoskeletal:       Feet:  Feet:     Left foot:     Skin integrity: No ulcer, blister, erythema or warmth.     Toenail Condition: Left toenails are normal.   Skin:    General: Skin is warm and dry.   Neurological:     General: No focal deficit present.     Mental Status: He is alert and oriented to person, place, and time.   Psychiatric:        Mood and Affect: Mood normal.        Behavior: Behavior normal.   Eye no   UC Treatments / Results  Labs (all labs ordered are listed, but only abnormal results are displayed) Labs Reviewed - No data to display  EKG   Radiology No results found.  Procedures Procedures (including critical care time)  Medications Ordered in UC Medications - No data to display  Initial Impression / Assessment and Plan / UC Course  I have reviewed the triage vital signs and the nursing notes.  Pertinent labs & imaging results that were available during my care of the patient were reviewed by me and considered in my medical decision making (see chart for details).  Puncture wound noted to the left foot that is now healing.  No obvious signs of infection are present to include redness, foul-smelling  drainage, or swelling.  Will start patient on Augmentin   875/125 mg tablets twice daily for the next 5 days for infection prophylaxis.  Supportive care recommendations were provided and discussed with the patient to include over-the-counter analgesics, wearing socks and shoes at all times, and to monitor for signs of worsening.  Patient was given strict follow-up precautions.  Patient was in agreement with this plan of care and verbalizes understanding.  All questions were answered.  Patient stable for discharge.    Final Clinical Impressions(s) / UC Diagnoses   Final diagnoses:  None   Discharge Instructions   None    ED Prescriptions   None    PDMP not reviewed this encounter.   Gilmer Etta PARAS, NP 02/11/24 1422

## 2024-02-11 NOTE — ED Triage Notes (Signed)
 Pt states he stepped on a nail 4 days ago with his left foot. Pt is up to date on tetanus shot last one was a year ago, per pt.

## 2024-02-11 NOTE — Discharge Instructions (Signed)
 Take medication as prescribed. You may take over-the-counter Tylenol or ibuprofen  as needed for pain or discomfort. Cleanse the affected area at least twice daily with warm soap and water. Make sure you are wearing shoes with good insole and support while symptoms persist.  Avoid going barefoot moving forward to prevent further injury or trauma. Seek care if you develop fever, chills, foul-smelling drainage from the site, or other concerns. Follow-up as needed.

## 2024-05-15 ENCOUNTER — Ambulatory Visit: Payer: Self-pay | Admitting: Internal Medicine

## 2024-05-21 ENCOUNTER — Encounter: Payer: Self-pay | Admitting: Internal Medicine

## 2024-10-14 ENCOUNTER — Ambulatory Visit: Payer: Self-pay | Admitting: Internal Medicine
